# Patient Record
Sex: Female | Born: 1970 | Race: Black or African American | Hispanic: No | Marital: Single | Smoking: Current every day smoker
Health system: Southern US, Community
[De-identification: ages and names within clinical notes are randomized; demographics above are authoritative.]

## PROBLEM LIST (undated history)

## (undated) DIAGNOSIS — F419 Anxiety disorder, unspecified: Secondary | ICD-10-CM

## (undated) DIAGNOSIS — I214 Non-ST elevation (NSTEMI) myocardial infarction: Secondary | ICD-10-CM

## (undated) DIAGNOSIS — Z720 Tobacco use: Secondary | ICD-10-CM

## (undated) DIAGNOSIS — F32A Depression, unspecified: Secondary | ICD-10-CM

## (undated) DIAGNOSIS — I2510 Atherosclerotic heart disease of native coronary artery without angina pectoris: Secondary | ICD-10-CM

## (undated) DIAGNOSIS — E785 Hyperlipidemia, unspecified: Secondary | ICD-10-CM

## (undated) DIAGNOSIS — F329 Major depressive disorder, single episode, unspecified: Secondary | ICD-10-CM

## (undated) HISTORY — PX: TUBAL LIGATION: SHX77

## (undated) HISTORY — DX: Non-ST elevation (NSTEMI) myocardial infarction: I21.4

## (undated) HISTORY — PX: KNEE SURGERY: SHX244

## (undated) HISTORY — PX: TONSILLECTOMY: SUR1361

---

## 1999-04-06 ENCOUNTER — Other Ambulatory Visit: Admission: RE | Admit: 1999-04-06 | Discharge: 1999-04-06 | Payer: Self-pay | Admitting: Family Medicine

## 2000-04-18 ENCOUNTER — Other Ambulatory Visit: Admission: RE | Admit: 2000-04-18 | Discharge: 2000-04-18 | Payer: Self-pay | Admitting: *Deleted

## 2000-06-05 ENCOUNTER — Encounter: Payer: Self-pay | Admitting: Family Medicine

## 2000-06-05 ENCOUNTER — Ambulatory Visit (HOSPITAL_COMMUNITY): Admission: RE | Admit: 2000-06-05 | Discharge: 2000-06-05 | Payer: Self-pay | Admitting: Family Medicine

## 2000-06-26 ENCOUNTER — Ambulatory Visit (HOSPITAL_COMMUNITY): Admission: RE | Admit: 2000-06-26 | Discharge: 2000-06-26 | Payer: Self-pay | Admitting: Family Medicine

## 2000-06-26 ENCOUNTER — Encounter: Payer: Self-pay | Admitting: Family Medicine

## 2000-06-28 ENCOUNTER — Ambulatory Visit (HOSPITAL_COMMUNITY): Admission: RE | Admit: 2000-06-28 | Discharge: 2000-06-28 | Payer: Self-pay | Admitting: Obstetrics

## 2003-02-28 ENCOUNTER — Other Ambulatory Visit: Admission: RE | Admit: 2003-02-28 | Discharge: 2003-02-28 | Payer: Self-pay | Admitting: Family Medicine

## 2012-12-19 ENCOUNTER — Encounter (HOSPITAL_BASED_OUTPATIENT_CLINIC_OR_DEPARTMENT_OTHER): Payer: Self-pay

## 2012-12-19 ENCOUNTER — Emergency Department (HOSPITAL_BASED_OUTPATIENT_CLINIC_OR_DEPARTMENT_OTHER)
Admission: EM | Admit: 2012-12-19 | Discharge: 2012-12-19 | Disposition: A | Discharge status: Home / Self Care | Payer: Self-pay | Attending: Emergency Medicine | Admitting: Emergency Medicine

## 2012-12-19 DIAGNOSIS — Z3202 Encounter for pregnancy test, result negative: Secondary | ICD-10-CM | POA: Insufficient documentation

## 2012-12-19 DIAGNOSIS — R5381 Other malaise: Secondary | ICD-10-CM | POA: Insufficient documentation

## 2012-12-19 DIAGNOSIS — F17200 Nicotine dependence, unspecified, uncomplicated: Secondary | ICD-10-CM | POA: Insufficient documentation

## 2012-12-19 DIAGNOSIS — F1210 Cannabis abuse, uncomplicated: Secondary | ICD-10-CM | POA: Insufficient documentation

## 2012-12-19 DIAGNOSIS — R109 Unspecified abdominal pain: Secondary | ICD-10-CM | POA: Insufficient documentation

## 2012-12-19 DIAGNOSIS — N390 Urinary tract infection, site not specified: Secondary | ICD-10-CM | POA: Insufficient documentation

## 2012-12-19 LAB — URINALYSIS, ROUTINE W REFLEX MICROSCOPIC
Bilirubin Urine: NEGATIVE
Glucose, UA: NEGATIVE mg/dL
Ketones, ur: NEGATIVE mg/dL
Nitrite: NEGATIVE
Protein, ur: NEGATIVE mg/dL
Specific Gravity, Urine: 1.02 (ref 1.005–1.030)
Urobilinogen, UA: 0.2 mg/dL (ref 0.0–1.0)
pH: 5.5 (ref 5.0–8.0)

## 2012-12-19 LAB — URINE MICROSCOPIC-ADD ON

## 2012-12-19 LAB — PREGNANCY, URINE: Preg Test, Ur: NEGATIVE

## 2012-12-19 MED ORDER — PHENAZOPYRIDINE HCL 200 MG PO TABS: 200.0000 mg | ORAL_TABLET | Freq: Three times a day (TID) | ORAL | Status: DC | PRN

## 2012-12-19 MED ORDER — CIPROFLOXACIN HCL 500 MG PO TABS: 500.0000 mg | ORAL_TABLET | Freq: Two times a day (BID) | ORAL | Status: DC

## 2012-12-19 NOTE — ED Notes (Signed)
Pt reports low abdominal pain, urinary frequency and fatigue x 2 days.

## 2012-12-19 NOTE — ED Provider Notes (Signed)
History     CSN: [id number]  Arrival date & time 12/19/12  1026   First MD Initiated Contact with Patient 12/19/12 1053      Chief Complaint  Patient presents with  . Urinary Frequency  . Abdominal Pain  . Fatigue    (Consider location/radiation/quality/duration/timing/severity/associated sxs/prior treatment) HPI Comments: The patient presents with lower abdominal discomfort, urinary frequency for the past two days.  The last period was last week and normal.  No abnormal bleeding or discharge.  Sexually active with same partner for the past several years.  Patient is a 42 y.o. female presenting with frequency. The history is provided by the patient.  Urinary Frequency This is a new problem. The current episode started 2 days ago. The problem occurs constantly. The problem has been gradually worsening. Nothing aggravates the symptoms. Nothing relieves the symptoms. She has tried nothing for the symptoms.    History reviewed. No pertinent past medical history.  Past Surgical History  Procedure Laterality Date  . Knee surgery      No family history on file.  History  Substance Use Topics  . Smoking status: Current Every Day Smoker -- 0.50 packs/day    Types: Cigarettes  . Smokeless tobacco: Not on file  . Alcohol Use: Yes     Comment: occasional    OB History   Grav Para Term Preterm Abortions TAB SAB Ect Mult Living                  Review of Systems  Genitourinary: Positive for frequency.  All other systems reviewed and are negative.    Allergies  Review of patient's allergies indicates no known allergies.  Home Medications  No current outpatient prescriptions on file.  BP 154/74  Pulse 81  Temp(Src) 97.9 F (36.6 C) (Oral)  Resp 20  Ht 5\' 1"  (1.549 m)  Wt 195 lb (88.451 kg)  BMI 36.86 kg/m2  SpO2 100%  Physical Exam  Nursing note and vitals reviewed. Constitutional: She is oriented to person, place, and time. She appears well-developed and  well-nourished. No distress.  HENT:  Head: Normocephalic and atraumatic.  Neck: Normal range of motion. Neck supple.  Cardiovascular: Normal rate and regular rhythm.  Exam reveals no gallop and no friction rub.   No murmur heard. Pulmonary/Chest: Effort normal and breath sounds normal. No respiratory distress. She has no wheezes.  Abdominal: Soft. Bowel sounds are normal. She exhibits no distension. There is no tenderness.  Musculoskeletal: Normal range of motion.  Neurological: She is alert and oriented to person, place, and time.  Skin: Skin is warm and dry. She is not diaphoretic.    ED Course  Procedures (including critical care time)  Labs Reviewed  PREGNANCY, URINE  URINALYSIS, ROUTINE W REFLEX MICROSCOPIC   No results found.   No diagnosis found.    MDM  UA is suggestive of uti.  Will treat with cipro, pyridium, return prn if she worsens.          Geoffery Lyons, MD 12/19/12 1125

## 2012-12-21 LAB — URINE CULTURE: Colony Count: 3000

## 2013-06-11 ENCOUNTER — Emergency Department (HOSPITAL_BASED_OUTPATIENT_CLINIC_OR_DEPARTMENT_OTHER)
Admission: EM | Admit: 2013-06-11 | Discharge: 2013-06-11 | Disposition: A | Discharge status: Home / Self Care | Payer: Self-pay | Attending: Emergency Medicine | Admitting: Emergency Medicine

## 2013-06-11 ENCOUNTER — Encounter (HOSPITAL_BASED_OUTPATIENT_CLINIC_OR_DEPARTMENT_OTHER): Payer: Self-pay | Admitting: *Deleted

## 2013-06-11 ENCOUNTER — Emergency Department (HOSPITAL_BASED_OUTPATIENT_CLINIC_OR_DEPARTMENT_OTHER): Payer: Self-pay

## 2013-06-11 DIAGNOSIS — M25512 Pain in left shoulder: Secondary | ICD-10-CM

## 2013-06-11 DIAGNOSIS — R059 Cough, unspecified: Secondary | ICD-10-CM | POA: Insufficient documentation

## 2013-06-11 DIAGNOSIS — F17200 Nicotine dependence, unspecified, uncomplicated: Secondary | ICD-10-CM | POA: Insufficient documentation

## 2013-06-11 DIAGNOSIS — M549 Dorsalgia, unspecified: Secondary | ICD-10-CM | POA: Insufficient documentation

## 2013-06-11 DIAGNOSIS — R079 Chest pain, unspecified: Secondary | ICD-10-CM | POA: Insufficient documentation

## 2013-06-11 DIAGNOSIS — M25519 Pain in unspecified shoulder: Secondary | ICD-10-CM | POA: Insufficient documentation

## 2013-06-11 DIAGNOSIS — R05 Cough: Secondary | ICD-10-CM | POA: Insufficient documentation

## 2013-06-11 DIAGNOSIS — J3489 Other specified disorders of nose and nasal sinuses: Secondary | ICD-10-CM | POA: Insufficient documentation

## 2013-06-11 MED ORDER — ALBUTEROL SULFATE (5 MG/ML) 0.5% IN NEBU
5.0000 mg | INHALATION_SOLUTION | Freq: Once | RESPIRATORY_TRACT | Status: AC
  Administered 2013-06-11: 5 mg via RESPIRATORY_TRACT
  Filled 2013-06-11: qty 1

## 2013-06-11 MED ORDER — IBUPROFEN 400 MG PO TABS
600.0000 mg | ORAL_TABLET | Freq: Once | ORAL | Status: AC
  Administered 2013-06-11: 600 mg via ORAL
  Filled 2013-06-11: qty 1

## 2013-06-11 MED ORDER — IBUPROFEN 600 MG PO TABS: 600.0000 mg | ORAL_TABLET | Freq: Three times a day (TID) | ORAL | Status: DC | PRN

## 2013-06-11 MED ORDER — IPRATROPIUM BROMIDE 0.02 % IN SOLN
0.5000 mg | Freq: Once | RESPIRATORY_TRACT | Status: AC
  Administered 2013-06-11: 0.5 mg via RESPIRATORY_TRACT
  Filled 2013-06-11: qty 2.5

## 2013-06-11 MED ORDER — OXYCODONE-ACETAMINOPHEN 5-325 MG PO TABS
1.0000 | ORAL_TABLET | Freq: Once | ORAL | Status: AC
  Administered 2013-06-11: 1 via ORAL
  Filled 2013-06-11 (×2): qty 1

## 2013-06-11 MED ORDER — DIAZEPAM 5 MG PO TABS
5.0000 mg | ORAL_TABLET | Freq: Once | ORAL | Status: AC
  Administered 2013-06-11: 5 mg via ORAL
  Filled 2013-06-11: qty 1

## 2013-06-11 MED ORDER — DIAZEPAM 5 MG PO TABS: 5.0000 mg | ORAL_TABLET | Freq: Three times a day (TID) | ORAL | Status: DC | PRN

## 2013-06-11 MED ORDER — HYDROCODONE-ACETAMINOPHEN 5-325 MG PO TABS: 1.0000 | ORAL_TABLET | ORAL | Status: DC | PRN

## 2013-06-11 NOTE — ED Notes (Signed)
MD at bedside. 

## 2013-06-11 NOTE — ED Notes (Signed)
Chest pain x 2 weeks. Radiation down her left arm.

## 2013-06-11 NOTE — ED Provider Notes (Signed)
CSN: [id number]     Arrival date & time 06/11/13  1946 History     First MD Initiated Contact with Patient 06/11/13 2002     Chief Complaint  Patient presents with  . Chest Pain    HPI Patient reports worsening pain in her chest and left trapezius region with radiation down her left arm over the past 2-3 weeks.  She states her pain is been constant and present over the past 2 weeks.  She reports that her pain in her left back and shoulder and arm is worse when she uses her left upper extremity.  She denies shortness of breath.  She has a cough and congestion.  She reports her cough is worsened over the past several days that she's been decreasing her tobacco use.  No history of DVT or pulmonary embolism.  No unilateral leg swelling.  No history of coronary artery disease.  No history of heart failure.   History reviewed. No pertinent past medical history. Past Surgical History  Procedure Laterality Date  . Knee surgery     No family history on file. History  Substance Use Topics  . Smoking status: Current Every Day Smoker -- 0.50 packs/day    Types: Cigarettes  . Smokeless tobacco: Not on file  . Alcohol Use: Yes     Comment: occasional   OB History   Grav Para Term Preterm Abortions TAB SAB Ect Mult Living                 Review of Systems  All other systems reviewed and are negative.    Allergies  Review of patient's allergies indicates no known allergies.  Home Medications   Current Outpatient Rx  Name  Route  Sig  Dispense  Refill  . ciprofloxacin (CIPRO) 500 MG tablet   Oral   Take 1 tablet (500 mg total) by mouth 2 (two) times daily. One po bid x 7 days   10 tablet   0   . diazepam (VALIUM) 5 MG tablet   Oral   Take 1 tablet (5 mg total) by mouth every 8 (eight) hours as needed (spasm).   10 tablet   0   . HYDROcodone-acetaminophen (NORCO/VICODIN) 5-325 MG per tablet   Oral   Take 1 tablet by mouth every 4 (four) hours as needed for pain.   15 tablet   0   . ibuprofen (ADVIL,MOTRIN) 600 MG tablet   Oral   Take 1 tablet (600 mg total) by mouth every 8 (eight) hours as needed for pain.   15 tablet   0   . phenazopyridine (PYRIDIUM) 200 MG tablet   Oral   Take 1 tablet (200 mg total) by mouth 3 (three) times daily as needed for pain.   6 tablet   0    BP 153/85  Pulse 68  Temp(Src) 99.2 F (37.3 C) (Oral)  Resp 20  Ht 5\' 1"  (1.549 m)  Wt 195 lb (88.451 kg)  BMI 36.86 kg/m2  SpO2 100% Physical Exam  Nursing note and vitals reviewed. Constitutional: She is oriented to person, place, and time. She appears well-developed and well-nourished. No distress.  HENT:  Head: Normocephalic and atraumatic.  Eyes: EOM are normal.  Neck: Normal range of motion.  Cardiovascular: Normal rate, regular rhythm and normal heart sounds.   Pulmonary/Chest: Effort normal and breath sounds normal.  Abdominal: Soft. She exhibits no distension. There is no tenderness.  Musculoskeletal: Normal range of motion.  Tenderness at  with associated spasm of left trapezius region.  Normal left radial pulse.  Normal grip strength bilaterally.  Neurological: She is alert and oriented to person, place, and time.  Skin: Skin is warm and dry.  Psychiatric: She has a normal mood and affect. Judgment normal.    ED Course   Procedures (including critical care time)   Date: 06/11/2013  Rate: 68  Rhythm: normal sinus rhythm  QRS Axis: normal  Intervals: normal  ST/T Wave abnormalities: normal  Conduction Disutrbances: none  Narrative Interpretation:   Old EKG Reviewed: no prior ecg    Labs Reviewed - No data to display Dg Chest 2 View  06/11/2013   *RADIOLOGY REPORT*  Clinical Data: Chest pain  CHEST - 2 VIEW  Comparison:  None.  Views: PA and lateral views.  FINDINGS: There is no focal infiltrate, pulmonary edema, or pleural effusion. The mediastinal contour and cardiac silhouette are normal. The soft tissue and osseous structures are normal.  IMPRESSION:  No acute cardiopulmonary disease identified.   Original Report Authenticated By: Sherian Rein, M.D.   I personally reviewed the imaging tests through PACS system I reviewed available ER/hospitalization records through the EMR   1. Chest pain   2. Left shoulder pain     MDM  Pain in her chest and left trapezius region seems to be more musculoskeletal in nature.  She does feel slightly better after breathing treatment.  She would discharge home with albuterol inhaler.  Home with muscle relaxants and pain medicine.  She understands return to the ER for new or worsening symptoms.  Lyanne Co, MD 06/11/13 2212

## 2014-06-17 ENCOUNTER — Encounter (HOSPITAL_BASED_OUTPATIENT_CLINIC_OR_DEPARTMENT_OTHER): Payer: Self-pay | Admitting: Emergency Medicine

## 2014-06-17 ENCOUNTER — Emergency Department (HOSPITAL_BASED_OUTPATIENT_CLINIC_OR_DEPARTMENT_OTHER)
Admission: EM | Admit: 2014-06-17 | Discharge: 2014-06-18 | Disposition: A | Discharge status: Home / Self Care | Payer: 59 | Attending: Emergency Medicine | Admitting: Emergency Medicine

## 2014-06-17 DIAGNOSIS — R110 Nausea: Secondary | ICD-10-CM | POA: Insufficient documentation

## 2014-06-17 DIAGNOSIS — R1013 Epigastric pain: Secondary | ICD-10-CM | POA: Diagnosis not present

## 2014-06-17 DIAGNOSIS — Z3202 Encounter for pregnancy test, result negative: Secondary | ICD-10-CM | POA: Insufficient documentation

## 2014-06-17 DIAGNOSIS — F17200 Nicotine dependence, unspecified, uncomplicated: Secondary | ICD-10-CM | POA: Diagnosis not present

## 2014-06-17 DIAGNOSIS — R109 Unspecified abdominal pain: Secondary | ICD-10-CM | POA: Diagnosis present

## 2014-06-17 DIAGNOSIS — Z79899 Other long term (current) drug therapy: Secondary | ICD-10-CM | POA: Diagnosis not present

## 2014-06-17 LAB — COMPREHENSIVE METABOLIC PANEL
ALBUMIN: 3.4 g/dL — AB (ref 3.5–5.2)
ALT: 9 U/L (ref 0–35)
ANION GAP: 13 (ref 5–15)
AST: 17 U/L (ref 0–37)
Alkaline Phosphatase: 84 U/L (ref 39–117)
BUN: 7 mg/dL (ref 6–23)
CALCIUM: 9.4 mg/dL (ref 8.4–10.5)
CO2: 23 mEq/L (ref 19–32)
Chloride: 101 mEq/L (ref 96–112)
Creatinine, Ser: 0.9 mg/dL (ref 0.50–1.10)
GFR calc non Af Amer: 77 mL/min — ABNORMAL LOW (ref >90)
GFR, EST AFRICAN AMERICAN: 89 mL/min — AB (ref >90)
GLUCOSE: 77 mg/dL (ref 70–99)
Potassium: 4 mEq/L (ref 3.7–5.3)
Sodium: 137 mEq/L (ref 137–147)
TOTAL PROTEIN: 7.6 g/dL (ref 6.0–8.3)
Total Bilirubin: <0.2 mg/dL — ABNORMAL LOW (ref 0.3–1.2)

## 2014-06-17 LAB — CBC
HEMATOCRIT: 36.4 % (ref 36.0–46.0)
HEMOGLOBIN: 12.3 g/dL (ref 12.0–15.0)
MCH: 25.3 pg — AB (ref 26.0–34.0)
MCHC: 33.8 g/dL (ref 30.0–36.0)
MCV: 74.7 fL — ABNORMAL LOW (ref 78.0–100.0)
Platelets: 268 10*3/uL (ref 150–400)
RBC: 4.87 MIL/uL (ref 3.87–5.11)
RDW: 15.8 % — AB (ref 11.5–15.5)
WBC: 6.2 10*3/uL (ref 4.0–10.5)

## 2014-06-17 LAB — URINALYSIS, ROUTINE W REFLEX MICROSCOPIC
Bilirubin Urine: NEGATIVE
Glucose, UA: NEGATIVE mg/dL
HGB URINE DIPSTICK: NEGATIVE
Ketones, ur: NEGATIVE mg/dL
Nitrite: NEGATIVE
PH: 6 (ref 5.0–8.0)
Protein, ur: NEGATIVE mg/dL
SPECIFIC GRAVITY, URINE: 1.018 (ref 1.005–1.030)
UROBILINOGEN UA: 1 mg/dL (ref 0.0–1.0)

## 2014-06-17 LAB — URINE MICROSCOPIC-ADD ON

## 2014-06-17 LAB — PREGNANCY, URINE: PREG TEST UR: NEGATIVE

## 2014-06-17 LAB — LIPASE, BLOOD: LIPASE: 20 U/L (ref 11–59)

## 2014-06-17 MED ORDER — LANSOPRAZOLE 30 MG PO CPDR
30.0000 mg | DELAYED_RELEASE_CAPSULE | Freq: Every day | ORAL | Status: DC
Start: 2014-06-17 — End: 2016-07-08

## 2014-06-17 MED ORDER — ONDANSETRON HCL 4 MG/2ML IJ SOLN
4.0000 mg | Freq: Once | INTRAMUSCULAR | Status: AC
  Administered 2014-06-17: 4 mg via INTRAVENOUS
  Filled 2014-06-17: qty 2

## 2014-06-17 MED ORDER — MORPHINE SULFATE 4 MG/ML IJ SOLN
4.0000 mg | Freq: Once | INTRAMUSCULAR | Status: AC
  Administered 2014-06-17: 4 mg via INTRAVENOUS
  Filled 2014-06-17: qty 1

## 2014-06-17 MED ORDER — GI COCKTAIL ~~LOC~~
30.0000 mL | Freq: Once | ORAL | Status: AC
  Administered 2014-06-17: 30 mL via ORAL
  Filled 2014-06-17: qty 30

## 2014-06-17 MED ORDER — GI COCKTAIL ~~LOC~~: 30.0000 mL | Freq: Once | ORAL | Status: DC

## 2014-06-17 NOTE — ED Notes (Signed)
Ginger ale given to patient. 

## 2014-06-17 NOTE — ED Notes (Signed)
Epigastric pain x5 weeks. Occasional vomiting but not today. Denies diarrhea. For a few days both breasts have been tender.

## 2014-06-17 NOTE — Discharge Instructions (Signed)
Return to the ED with any concerns including vomiting and not able to keep down liquids, fever/chills, worsening pain, decreased level of alertness/lethargy  If your pain continues you should arrange for a followup appointment with your primary care doctor and may need and abdominal ultrasound

## 2014-06-17 NOTE — ED Provider Notes (Signed)
CSN: [id number]     Arrival date & time 06/17/14  1844 History   First MD Initiated Contact with Patient 06/17/14 2057     This chart was scribed for Ethelda ChickMartha K Linker, MD by Arlan OrganAshley Leger, ED Scribe. This patient was seen in room MH04/MH04 and the patient's care was started 9:33 PM.   Chief Complaint  Patient presents with  . Abdominal Pain   The history is provided by the patient. No language interpreter was used.    HPI Comments: Nancy Ayers is a 43 y.o. female who presents to the Emergency Department complaining of waxing and waning, moderate epigastric abdominal pain x 6 months that has progressively worsened in the last 4 weeks. Pt admits to some nausea associated with abdominal pain. She has tried OTC Asprin, Ibuprofen, and Tylenol without any improvement for symptoms. She has also tried Prilosec and Zantac with no relief for pain. At this time she denies any vomiting, fever, dysuria or chills. No known allergies to medications. No other concerns this visit.  History reviewed. No pertinent past medical history. Past Surgical History  Procedure Laterality Date  . Knee surgery    . Knee surgery    . Tubal ligation     No family history on file. History  Substance Use Topics  . Smoking status: Current Every Day Smoker -- 0.50 packs/day    Types: Cigarettes  . Smokeless tobacco: Not on file  . Alcohol Use: Yes     Comment: occasional   OB History   Grav Para Term Preterm Abortions TAB SAB Ect Mult Living                 Review of Systems  Constitutional: Negative for fever and chills.  Gastrointestinal: Positive for nausea and abdominal pain. Negative for vomiting.  Genitourinary: Negative for dysuria.  Psychiatric/Behavioral: Negative for confusion.      Allergies  Review of patient's allergies indicates no known allergies.  Home Medications   Prior to Admission medications   Medication Sig Start Date End Date Taking? Authorizing Provider  ciprofloxacin (CIPRO)  500 MG tablet Take 1 tablet (500 mg total) by mouth 2 (two) times daily. One po bid x 7 days 12/19/12   Geoffery Lyonsouglas Delo, MD  diazepam (VALIUM) 5 MG tablet Take 1 tablet (5 mg total) by mouth every 8 (eight) hours as needed (spasm). 06/11/13   Lyanne CoKevin M Campos, MD  HYDROcodone-acetaminophen (NORCO/VICODIN) 5-325 MG per tablet Take 1 tablet by mouth every 4 (four) hours as needed for pain. 06/11/13   Lyanne CoKevin M Campos, MD  ibuprofen (ADVIL,MOTRIN) 600 MG tablet Take 1 tablet (600 mg total) by mouth every 8 (eight) hours as needed for pain. 06/11/13   Lyanne CoKevin M Campos, MD  lansoprazole (PREVACID) 30 MG capsule Take 1 capsule (30 mg total) by mouth daily at 12 noon. 06/17/14   Ethelda ChickMartha K Linker, MD  phenazopyridine (PYRIDIUM) 200 MG tablet Take 1 tablet (200 mg total) by mouth 3 (three) times daily as needed for pain. 12/19/12   Geoffery Lyonsouglas Delo, MD   Triage Vitals: BP 158/101  Pulse 68  Temp(Src) 98.1 F (36.7 C) (Oral)  Resp 16  Ht 5\' 2"  (1.575 m)  Wt 195 lb (88.451 kg)  BMI 35.66 kg/m2  SpO2 100%  LMP 05/29/2014    Physical Exam  Nursing note and vitals reviewed. Constitutional: She is oriented to person, place, and time. She appears well-developed and well-nourished.  HENT:  Head: Normocephalic.  Eyes: EOM are normal.  Neck:  Normal range of motion.  Pulmonary/Chest: Effort normal.  Abdominal: She exhibits no distension.  Musculoskeletal: Normal range of motion.  Neurological: She is alert and oriented to person, place, and time.  Psychiatric: She has a normal mood and affect.  Note- mild epigastric tenderness to palpation, no gaurding or rebound tenderness, NABS, no conjunctival injection, no scleral icterus, Skin- no rash  ED Course  Procedures (including critical care time)  DIAGNOSTIC STUDIES: Oxygen Saturation is 100% on RA, Normal by my interpretation.    COORDINATION OF CARE: 9:33 PM- Will order CBC, CMP, lipase, pregnancy urine, and urinalysis. Discussed treatment plan with pt at bedside and pt  agreed to plan.    .   Labs Review Labs Reviewed  URINALYSIS, ROUTINE W REFLEX MICROSCOPIC - Abnormal; Notable for the following:    APPearance CLOUDY (*)    Leukocytes, UA SMALL (*)    All other components within normal limits  URINE MICROSCOPIC-ADD ON - Abnormal; Notable for the following:    Squamous Epithelial / LPF MANY (*)    Bacteria, UA FEW (*)    All other components within normal limits  CBC - Abnormal; Notable for the following:    MCV 74.7 (*)    MCH 25.3 (*)    RDW 15.8 (*)    All other components within normal limits  COMPREHENSIVE METABOLIC PANEL - Abnormal; Notable for the following:    Albumin 3.4 (*)    Total Bilirubin <0.2 (*)    GFR calc non Af Amer 77 (*)    GFR calc Af Amer 89 (*)    All other components within normal limits  PREGNANCY, URINE  LIPASE, BLOOD    Imaging Review No results found.   EKG Interpretation None      MDM   Final diagnoses:  Epigastric pain   Pt presenting with c/o epigastric pain.  Labs reassuring, IV fluids and pain and nausea meds given along with GI cocktail.  Pt advised that if symptoms continue she may need abdominal ultrasound as an outpatient.  Discharged with strict return precautions.  Pt agreeable with plan.  I personally performed the services described in this documentation, which was scribed in my presence. The recorded information has been reviewed and is accurate.    Ethelda Chick, MD 06/21/14 [phone]

## 2016-07-08 ENCOUNTER — Encounter (HOSPITAL_BASED_OUTPATIENT_CLINIC_OR_DEPARTMENT_OTHER): Payer: Self-pay

## 2016-07-08 ENCOUNTER — Emergency Department (HOSPITAL_BASED_OUTPATIENT_CLINIC_OR_DEPARTMENT_OTHER)
Admission: EM | Admit: 2016-07-08 | Discharge: 2016-07-08 | Disposition: A | Discharge status: Home / Self Care | Payer: 59 | Attending: Emergency Medicine | Admitting: Emergency Medicine

## 2016-07-08 DIAGNOSIS — F17210 Nicotine dependence, cigarettes, uncomplicated: Secondary | ICD-10-CM | POA: Insufficient documentation

## 2016-07-08 DIAGNOSIS — R319 Hematuria, unspecified: Secondary | ICD-10-CM

## 2016-07-08 DIAGNOSIS — R0602 Shortness of breath: Secondary | ICD-10-CM | POA: Diagnosis not present

## 2016-07-08 DIAGNOSIS — N390 Urinary tract infection, site not specified: Secondary | ICD-10-CM | POA: Insufficient documentation

## 2016-07-08 DIAGNOSIS — R1032 Left lower quadrant pain: Secondary | ICD-10-CM | POA: Diagnosis present

## 2016-07-08 DIAGNOSIS — R1030 Lower abdominal pain, unspecified: Secondary | ICD-10-CM

## 2016-07-08 DIAGNOSIS — R51 Headache: Secondary | ICD-10-CM | POA: Insufficient documentation

## 2016-07-08 LAB — COMPREHENSIVE METABOLIC PANEL
ALBUMIN: 3.6 g/dL (ref 3.5–5.0)
ALK PHOS: 82 U/L (ref 38–126)
ALT: 13 U/L — ABNORMAL LOW (ref 14–54)
ANION GAP: 6 (ref 5–15)
AST: 20 U/L (ref 15–41)
BILIRUBIN TOTAL: 0.4 mg/dL (ref 0.3–1.2)
BUN: 7 mg/dL (ref 6–20)
CALCIUM: 8.6 mg/dL — AB (ref 8.9–10.3)
CO2: 26 mmol/L (ref 22–32)
Chloride: 104 mmol/L (ref 101–111)
Creatinine, Ser: 0.84 mg/dL (ref 0.44–1.00)
GFR calc Af Amer: >60 mL/min (ref >60)
GFR calc non Af Amer: >60 mL/min (ref >60)
GLUCOSE: 74 mg/dL (ref 65–99)
Potassium: 3.7 mmol/L (ref 3.5–5.1)
Sodium: 136 mmol/L (ref 135–145)
TOTAL PROTEIN: 7.4 g/dL (ref 6.5–8.1)

## 2016-07-08 LAB — CBC WITH DIFFERENTIAL/PLATELET
BASOS PCT: 1 %
Basophils Absolute: 0 10*3/uL (ref 0.0–0.1)
EOS ABS: 0.1 10*3/uL (ref 0.0–0.7)
Eosinophils Relative: 3 %
HCT: 35.9 % — ABNORMAL LOW (ref 36.0–46.0)
Hemoglobin: 11.9 g/dL — ABNORMAL LOW (ref 12.0–15.0)
Lymphocytes Relative: 50 %
Lymphs Abs: 2.7 10*3/uL (ref 0.7–4.0)
MCH: 24 pg — ABNORMAL LOW (ref 26.0–34.0)
MCHC: 33.1 g/dL (ref 30.0–36.0)
MCV: 72.5 fL — ABNORMAL LOW (ref 78.0–100.0)
MONO ABS: 0.5 10*3/uL (ref 0.1–1.0)
MONOS PCT: 10 %
Neutro Abs: 1.9 10*3/uL (ref 1.7–7.7)
Neutrophils Relative %: 36 %
PLATELETS: 261 10*3/uL (ref 150–400)
RBC: 4.95 MIL/uL (ref 3.87–5.11)
RDW: 16.7 % — AB (ref 11.5–15.5)
WBC: 5.3 10*3/uL (ref 4.0–10.5)

## 2016-07-08 LAB — URINALYSIS, ROUTINE W REFLEX MICROSCOPIC
BILIRUBIN URINE: NEGATIVE
GLUCOSE, UA: NEGATIVE mg/dL
Ketones, ur: NEGATIVE mg/dL
NITRITE: NEGATIVE
PH: 6 (ref 5.0–8.0)
Protein, ur: 30 mg/dL — AB
SPECIFIC GRAVITY, URINE: 1.019 (ref 1.005–1.030)

## 2016-07-08 LAB — URINE MICROSCOPIC-ADD ON

## 2016-07-08 LAB — LIPASE, BLOOD: Lipase: 19 U/L (ref 11–51)

## 2016-07-08 LAB — PREGNANCY, URINE: Preg Test, Ur: NEGATIVE

## 2016-07-08 MED ORDER — ONDANSETRON HCL 4 MG PO TABS: 4.0000 mg | ORAL_TABLET | Freq: Four times a day (QID) | ORAL | 0 refills | Status: DC

## 2016-07-08 MED ORDER — NITROFURANTOIN MONOHYD MACRO 100 MG PO CAPS: 100.0000 mg | ORAL_CAPSULE | Freq: Two times a day (BID) | ORAL | 0 refills | Status: DC

## 2016-07-08 MED ORDER — NITROFURANTOIN MONOHYD MACRO 100 MG PO CAPS
100.0000 mg | ORAL_CAPSULE | Freq: Once | ORAL | Status: AC
  Administered 2016-07-08: 100 mg via ORAL
  Filled 2016-07-08: qty 1

## 2016-07-08 MED ORDER — DICYCLOMINE HCL 10 MG PO CAPS
10.0000 mg | ORAL_CAPSULE | Freq: Once | ORAL | Status: AC
  Administered 2016-07-08: 10 mg via ORAL
  Filled 2016-07-08: qty 1

## 2016-07-08 NOTE — ED Provider Notes (Signed)
Emergency Department Provider Note By signing my name below, I, Emmanuella Mensah, attest that this documentation has been prepared under the direction and in the presence of Maia PlanJoshua G Millena Callins, MD. Electronically Signed: Angelene GiovanniEmmanuella Mensah, ED Scribe. 07/08/16. 8:36 PM.   Maia PlanJoshua G Ilena Dieckman, MD has reviewed the triage vital signs and the nursing notes.   HISTORY  Chief Complaint Abdominal Pain   HPI Comments: Nancy PhillipsWillette Ayers is a 45 y.o. female who presents to the Emergency Department complaining of ongoing gradually worsening moderate left lower abdominal pain onset 2 weeks ago. She notes that her pain radiates upward in a "C" shape. She reports associated increasing SOB, ongoing 7/10 constant headache and hematuria with multiple episodes of bright red blood as she wipes onset one week ago. She adds that she has tried to wear a pad but she only sees blood when she urinates and when she wipes. No alleviating factors noted. She states that she has tried Aspirin and Ibuprofen with no relief. She denies a hx of abdominal surgeries. She also denies any fever, chills, back pain, neck pain, or any n/v/d.    History reviewed. No pertinent past medical history.  There are no active problems to display for this patient.   Past Surgical History:  Procedure Laterality Date  . KNEE SURGERY    . KNEE SURGERY    . TUBAL LIGATION        Allergies Review of patient's allergies indicates no known allergies.  No family history on file.  Social History Social History  Substance Use Topics  . Smoking status: Current Every Day Smoker    Packs/day: 0.50    Types: Cigarettes  . Smokeless tobacco: Never Used  . Alcohol use Yes     Comment: occasional    Review of Systems Constitutional: No fever/chills Eyes: No visual changes. ENT: No sore throat. Cardiovascular: Denies chest pain. Respiratory: Shortness of breath. Gastrointestinal: Abdominal pain.  No nausea, no vomiting.  No diarrhea.  No  constipation. Genitourinary: Negative for dysuria. Musculoskeletal: Negative for back pain. Skin: Negative for rash. Neurological: Headache. Negative for focal weakness or numbness.  10-point ROS otherwise negative.  ____________________________________________   PHYSICAL EXAM:  VITAL SIGNS: ED Triage Vitals  Enc Vitals Group     BP 07/08/16 1925 156/92     Pulse Rate 07/08/16 1925 72     Resp 07/08/16 1925 18     Temp 07/08/16 1925 98.1 F (36.7 C)     Temp Source 07/08/16 1925 Oral     SpO2 07/08/16 1925 98 %     Weight 07/08/16 1926 234 lb (106.1 kg)     Height 07/08/16 1926 5\' 1"  (1.549 m)     Pain Score 07/08/16 1924 8    Constitutional: Alert and oriented. Well appearing and in no acute distress. Eyes: Conjunctivae are normal.  Head: Atraumatic. Nose: No congestion/rhinnorhea. Mouth/Throat: Mucous membranes are moist.  Oropharynx non-erythematous. Neck: No stridor.  Cardiovascular: Normal rate, regular rhythm. Good peripheral circulation. Grossly normal heart sounds.   Respiratory: Normal respiratory effort.  No retractions. Lungs CTAB. Gastrointestinal: Soft and nontender. No distention.  Musculoskeletal: No lower extremity tenderness nor edema. No gross deformities of extremities. Neurologic:  Normal speech and language. No gross focal neurologic deficits are appreciated.  Skin:  Skin is warm, dry and intact. No rash noted. Psychiatric: Mood and affect are normal. Speech and behavior are normal.  ____________________________________________ DIAGNOSTIC STUDIES: Oxygen Saturation is 98% on RA, normal by my interpretation.  COORDINATION OF CARE: 8:15 PM- Pt advised of plan for treatment and pt agrees. She will receive lab work for further evaluation. Pt will also receive Bentyl.    LABS (all labs ordered are listed, but only abnormal results are displayed)  Labs Reviewed  URINALYSIS, ROUTINE W REFLEX MICROSCOPIC (NOT AT [facility]) - Abnormal; Notable for the  following:       Result Value   APPearance CLOUDY (*)    Hgb urine dipstick LARGE (*)    Protein, ur 30 (*)    Leukocytes, UA LARGE (*)    All other components within normal limits  COMPREHENSIVE METABOLIC PANEL - Abnormal; Notable for the following:    Calcium 8.6 (*)    ALT 13 (*)    All other components within normal limits  CBC WITH DIFFERENTIAL/PLATELET - Abnormal; Notable for the following:    Hemoglobin 11.9 (*)    HCT 35.9 (*)    MCV 72.5 (*)    MCH 24.0 (*)    RDW 16.7 (*)    All other components within normal limits  URINE MICROSCOPIC-ADD ON - Abnormal; Notable for the following:    Squamous Epithelial / LPF 6-30 (*)    Bacteria, UA FEW (*)    All other components within normal limits  PREGNANCY, URINE  LIPASE, BLOOD    ____________________________________________  RADIOLOGY  None ____________________________________________   PROCEDURES  Procedure(s) performed:   Procedures  None ____________________________________________   INITIAL IMPRESSION / ASSESSMENT AND PLAN / ED COURSE  Pertinent labs & imaging results that were available during my care of the patient were reviewed by me and considered in my medical decision making (see chart for details).  Patient with 2 weeks of lower abdominal discomfort, HA, and hematuria. Abdomen is soft and non-tender to palpation.   Differential diagnosis includes but is not exclusive to ectopic pregnancy, ovarian cyst, ovarian torsion, acute appendicitis, urinary tract infection, endometriosis, bowel obstruction, hernia, colitis, renal colic, gastroenteritis, volvulus etc.  Plan for labs and symptomatic management.   09:50 PM Patient with some evidence of urinary tract infection. She has no fever, CVA tenderness, or other symptoms to suggest pyelonephritis. We'll treat with Macrobid and discharge home with primary care physician follow-up. We'll also provide the contact information for OB/GYN should her symptoms not  resolve with UTI treatment. Discussed this plan in detail with the patient who verbalizes understanding and is in agreement. She is pleased at discharge.   At this time, I do not feel there is any life-threatening condition present. I have reviewed and discussed all results (EKG, imaging, lab, urine as appropriate), exam findings with patient. I have reviewed nursing notes and appropriate previous records.  I feel the patient is safe to be discharged home without further emergent workup. Discussed usual and customary return precautions. Patient and family (if present) verbalize understanding and are comfortable with this plan.  Patient will follow-up with their primary care provider. If they do not have a primary care provider, information for follow-up has been provided to them. All questions have been answered.  ____________________________________________  FINAL CLINICAL IMPRESSION(S) / ED DIAGNOSES  Final diagnoses:  Lower abdominal pain  Hematuria  UTI (lower urinary tract infection)     MEDICATIONS GIVEN DURING THIS VISIT:  Medications  dicyclomine (BENTYL) capsule 10 mg (10 mg Oral Given 07/08/16 2032)  nitrofurantoin (macrocrystal-monohydrate) (MACROBID) capsule 100 mg (100 mg Oral Given 07/08/16 2158)     NEW OUTPATIENT MEDICATIONS STARTED DURING THIS VISIT:  Discharge Medication List as  of 07/08/2016  9:53 PM    START taking these medications   Details  nitrofurantoin, macrocrystal-monohydrate, (MACROBID) 100 MG capsule Take 1 capsule (100 mg total) by mouth 2 (two) times daily., Starting Fri 07/08/2016, Print    ondansetron (ZOFRAN) 4 MG tablet Take 1 tablet (4 mg total) by mouth every 6 (six) hours., Starting Fri 07/08/2016, Print        Documentation performed with the assistance of the scribe. I have reviewed documentation and made changes as necessary.   Note:  This document was prepared using Dragon voice recognition software and may include unintentional dictation  errors.  Alona Bene, MD Emergency Medicine    Maia Plan, MD 07/09/16 [phone]

## 2016-07-08 NOTE — ED Triage Notes (Signed)
C/o abd pain, HA x 1 week-also c/o SOB-NAD-steady gait

## 2016-07-08 NOTE — Discharge Instructions (Signed)

## 2016-08-25 DIAGNOSIS — F1290 Cannabis use, unspecified, uncomplicated: Secondary | ICD-10-CM | POA: Insufficient documentation

## 2016-10-07 HISTORY — PX: COMBINED HYSTEROSCOPY DIAGNOSTIC / D&C: SUR297

## 2016-11-28 DIAGNOSIS — F321 Major depressive disorder, single episode, moderate: Secondary | ICD-10-CM | POA: Insufficient documentation

## 2017-06-15 ENCOUNTER — Emergency Department (HOSPITAL_BASED_OUTPATIENT_CLINIC_OR_DEPARTMENT_OTHER): Payer: Self-pay

## 2017-06-15 ENCOUNTER — Ambulatory Visit (HOSPITAL_COMMUNITY): Admit: 2017-06-15 | Payer: 59 | Admitting: Cardiology

## 2017-06-15 ENCOUNTER — Encounter (HOSPITAL_BASED_OUTPATIENT_CLINIC_OR_DEPARTMENT_OTHER): Payer: Self-pay | Admitting: *Deleted

## 2017-06-15 ENCOUNTER — Encounter (HOSPITAL_COMMUNITY)
Admission: EM | Disposition: A | Discharge status: Home / Self Care | Payer: Self-pay | Source: Home / Self Care | Attending: Cardiology

## 2017-06-15 ENCOUNTER — Inpatient Hospital Stay (HOSPITAL_BASED_OUTPATIENT_CLINIC_OR_DEPARTMENT_OTHER)
Admission: EM | Admit: 2017-06-15 | Discharge: 2017-06-19 | DRG: 281 | Disposition: A | Discharge status: Home / Self Care | Payer: 59 | Attending: Cardiology | Admitting: Cardiology

## 2017-06-15 DIAGNOSIS — F17210 Nicotine dependence, cigarettes, uncomplicated: Secondary | ICD-10-CM | POA: Diagnosis present

## 2017-06-15 DIAGNOSIS — E669 Obesity, unspecified: Secondary | ICD-10-CM | POA: Diagnosis present

## 2017-06-15 DIAGNOSIS — E785 Hyperlipidemia, unspecified: Secondary | ICD-10-CM

## 2017-06-15 DIAGNOSIS — I10 Essential (primary) hypertension: Secondary | ICD-10-CM | POA: Diagnosis present

## 2017-06-15 DIAGNOSIS — I2510 Atherosclerotic heart disease of native coronary artery without angina pectoris: Secondary | ICD-10-CM | POA: Diagnosis not present

## 2017-06-15 DIAGNOSIS — F419 Anxiety disorder, unspecified: Secondary | ICD-10-CM | POA: Diagnosis present

## 2017-06-15 DIAGNOSIS — Z6841 Body Mass Index (BMI) 40.0 and over, adult: Secondary | ICD-10-CM

## 2017-06-15 DIAGNOSIS — Z801 Family history of malignant neoplasm of trachea, bronchus and lung: Secondary | ICD-10-CM

## 2017-06-15 DIAGNOSIS — K760 Fatty (change of) liver, not elsewhere classified: Secondary | ICD-10-CM | POA: Diagnosis present

## 2017-06-15 DIAGNOSIS — I249 Acute ischemic heart disease, unspecified: Secondary | ICD-10-CM

## 2017-06-15 DIAGNOSIS — Z833 Family history of diabetes mellitus: Secondary | ICD-10-CM

## 2017-06-15 DIAGNOSIS — I25110 Atherosclerotic heart disease of native coronary artery with unstable angina pectoris: Secondary | ICD-10-CM | POA: Diagnosis present

## 2017-06-15 DIAGNOSIS — Z8249 Family history of ischemic heart disease and other diseases of the circulatory system: Secondary | ICD-10-CM

## 2017-06-15 DIAGNOSIS — F17200 Nicotine dependence, unspecified, uncomplicated: Secondary | ICD-10-CM

## 2017-06-15 DIAGNOSIS — Z832 Family history of diseases of the blood and blood-forming organs and certain disorders involving the immune mechanism: Secondary | ICD-10-CM

## 2017-06-15 DIAGNOSIS — I214 Non-ST elevation (NSTEMI) myocardial infarction: Principal | ICD-10-CM | POA: Diagnosis present

## 2017-06-15 DIAGNOSIS — F329 Major depressive disorder, single episode, unspecified: Secondary | ICD-10-CM | POA: Diagnosis present

## 2017-06-15 HISTORY — PX: INTRAVASCULAR PRESSURE WIRE/FFR STUDY: CATH118243

## 2017-06-15 HISTORY — DX: Tobacco use: Z72.0

## 2017-06-15 HISTORY — DX: Hyperlipidemia, unspecified: E78.5

## 2017-06-15 HISTORY — DX: Atherosclerotic heart disease of native coronary artery without angina pectoris: I25.10

## 2017-06-15 HISTORY — DX: Major depressive disorder, single episode, unspecified: F32.9

## 2017-06-15 HISTORY — DX: Depression, unspecified: F32.A

## 2017-06-15 HISTORY — DX: Anxiety disorder, unspecified: F41.9

## 2017-06-15 HISTORY — PX: LEFT HEART CATH AND CORONARY ANGIOGRAPHY: CATH118249

## 2017-06-15 LAB — TSH: TSH: 1.272 u[IU]/mL (ref 0.350–4.500)

## 2017-06-15 LAB — COMPREHENSIVE METABOLIC PANEL
ALBUMIN: 3.7 g/dL (ref 3.5–5.0)
ALK PHOS: 84 U/L (ref 38–126)
ALT: 15 U/L (ref 14–54)
AST: 33 U/L (ref 15–41)
Anion gap: 8 (ref 5–15)
BILIRUBIN TOTAL: 0.9 mg/dL (ref 0.3–1.2)
BUN: 8 mg/dL (ref 6–20)
CO2: 26 mmol/L (ref 22–32)
CREATININE: 0.93 mg/dL (ref 0.44–1.00)
Calcium: 8.6 mg/dL — ABNORMAL LOW (ref 8.9–10.3)
Chloride: 102 mmol/L (ref 101–111)
GFR calc Af Amer: >60 mL/min (ref >60)
GLUCOSE: 77 mg/dL (ref 65–99)
POTASSIUM: 4 mmol/L (ref 3.5–5.1)
Sodium: 136 mmol/L (ref 135–145)
TOTAL PROTEIN: 7.6 g/dL (ref 6.5–8.1)

## 2017-06-15 LAB — TROPONIN I
TROPONIN I: 1.03 ng/mL — AB (ref <0.03)
TROPONIN I: 1.09 ng/mL — AB (ref <0.03)

## 2017-06-15 LAB — LIPASE, BLOOD
LIPASE: 24 U/L (ref 11–51)
LIPASE: 23 U/L (ref 11–51)

## 2017-06-15 LAB — CBC
HCT: 40.2 % (ref 36.0–46.0)
HEMOGLOBIN: 14.1 g/dL (ref 12.0–15.0)
MCH: 25.5 pg — ABNORMAL LOW (ref 26.0–34.0)
MCHC: 35.1 g/dL (ref 30.0–36.0)
MCV: 72.6 fL — ABNORMAL LOW (ref 78.0–100.0)
PLATELETS: 233 10*3/uL (ref 150–400)
RBC: 5.54 MIL/uL — ABNORMAL HIGH (ref 3.87–5.11)
RDW: 18.4 % — AB (ref 11.5–15.5)
WBC: 5.9 10*3/uL (ref 4.0–10.5)

## 2017-06-15 LAB — HEMOGLOBIN A1C
Hgb A1c MFr Bld: 5.4 % (ref 4.8–5.6)
MEAN PLASMA GLUCOSE: 108.28 mg/dL

## 2017-06-15 SURGERY — LEFT HEART CATH AND CORONARY ANGIOGRAPHY
Anesthesia: LOCAL

## 2017-06-15 MED ORDER — KETOROLAC TROMETHAMINE 30 MG/ML IJ SOLN
30.0000 mg | Freq: Once | INTRAMUSCULAR | Status: AC
  Administered 2017-06-15: 30 mg via INTRAVENOUS
  Filled 2017-06-15: qty 1

## 2017-06-15 MED ORDER — ADENOSINE 12 MG/4ML IV SOLN
INTRAVENOUS | Status: AC
  Filled 2017-06-15: qty 16

## 2017-06-15 MED ORDER — SODIUM CHLORIDE 0.9 % IV BOLUS (SEPSIS)
1000.0000 mL | Freq: Once | INTRAVENOUS | Status: AC
  Administered 2017-06-15: 1000 mL via INTRAVENOUS

## 2017-06-15 MED ORDER — IOPAMIDOL (ISOVUE-370) INJECTION 76%
INTRAVENOUS | Status: AC
  Filled 2017-06-15: qty 100

## 2017-06-15 MED ORDER — ONDANSETRON HCL 4 MG/2ML IJ SOLN
4.0000 mg | Freq: Four times a day (QID) | INTRAMUSCULAR | Status: DC | PRN
  Administered 2017-06-17: 4 mg via INTRAVENOUS
  Filled 2017-06-15: qty 2

## 2017-06-15 MED ORDER — SODIUM CHLORIDE 0.9% FLUSH
3.0000 mL | Freq: Two times a day (BID) | INTRAVENOUS | Status: DC
  Administered 2017-06-16 – 2017-06-18 (×6): 3 mL via INTRAVENOUS

## 2017-06-15 MED ORDER — FENTANYL CITRATE (PF) 100 MCG/2ML IJ SOLN
INTRAMUSCULAR | Status: AC
  Filled 2017-06-15: qty 2

## 2017-06-15 MED ORDER — ADENOSINE (DIAGNOSTIC) 140MCG/KG/MIN
INTRAVENOUS | Status: DC | PRN
  Administered 2017-06-15: 140 ug/kg/min via INTRAVENOUS

## 2017-06-15 MED ORDER — SODIUM CHLORIDE 0.9 % IV SOLN
INTRAVENOUS | Status: DC | PRN
  Administered 2017-06-15: 10 mL/h via INTRAVENOUS

## 2017-06-15 MED ORDER — SODIUM CHLORIDE 0.9 % IV SOLN
INTRAVENOUS | Status: AC | PRN
  Administered 2017-06-15: 100 mL/h via INTRAVENOUS

## 2017-06-15 MED ORDER — HEPARIN BOLUS VIA INFUSION
4000.0000 [IU] | Freq: Once | INTRAVENOUS | Status: AC
  Administered 2017-06-15: 4000 [IU] via INTRAVENOUS

## 2017-06-15 MED ORDER — LIDOCAINE HCL (PF) 1 % IJ SOLN
INTRAMUSCULAR | Status: AC
  Filled 2017-06-15: qty 30

## 2017-06-15 MED ORDER — NITROGLYCERIN 0.4 MG SL SUBL
SUBLINGUAL_TABLET | SUBLINGUAL | Status: AC
  Filled 2017-06-15: qty 3

## 2017-06-15 MED ORDER — HEPARIN (PORCINE) IN NACL 2-0.9 UNIT/ML-% IJ SOLN
INTRAMUSCULAR | Status: AC
  Filled 2017-06-15: qty 500

## 2017-06-15 MED ORDER — ACETAMINOPHEN 325 MG PO TABS: 650.0000 mg | ORAL_TABLET | ORAL | Status: DC | PRN

## 2017-06-15 MED ORDER — NITROGLYCERIN IN D5W 200-5 MCG/ML-% IV SOLN
0.0000 ug/min | INTRAVENOUS | Status: DC
  Administered 2017-06-15: 5 ug/min via INTRAVENOUS

## 2017-06-15 MED ORDER — HEPARIN (PORCINE) IN NACL 100-0.45 UNIT/ML-% IJ SOLN
INTRAMUSCULAR | Status: AC
  Filled 2017-06-15: qty 250

## 2017-06-15 MED ORDER — SODIUM CHLORIDE 0.9 % IV SOLN: 1000.0000 mL | INTRAVENOUS | Status: DC

## 2017-06-15 MED ORDER — HEPARIN (PORCINE) IN NACL 2-0.9 UNIT/ML-% IJ SOLN
INTRAMUSCULAR | Status: DC | PRN
  Administered 2017-06-15: 20:00:00

## 2017-06-15 MED ORDER — MIDAZOLAM HCL 2 MG/2ML IJ SOLN
INTRAMUSCULAR | Status: DC | PRN
  Administered 2017-06-15: 2 mg via INTRAVENOUS

## 2017-06-15 MED ORDER — HEPARIN (PORCINE) IN NACL 100-0.45 UNIT/ML-% IJ SOLN
1150.0000 [IU]/h | INTRAMUSCULAR | Status: DC
  Administered 2017-06-16: 900 [IU]/h via INTRAVENOUS
  Administered 2017-06-17 – 2017-06-18 (×2): 1150 [IU]/h via INTRAVENOUS
  Filled 2017-06-15 (×3): qty 250

## 2017-06-15 MED ORDER — HEPARIN SODIUM (PORCINE) 1000 UNIT/ML IJ SOLN
INTRAMUSCULAR | Status: AC
  Filled 2017-06-15: qty 1

## 2017-06-15 MED ORDER — MORPHINE SULFATE (PF) 4 MG/ML IV SOLN
2.0000 mg | INTRAVENOUS | Status: DC | PRN
  Administered 2017-06-17 (×3): 2 mg via INTRAVENOUS
  Filled 2017-06-15 (×3): qty 1

## 2017-06-15 MED ORDER — CLOPIDOGREL BISULFATE 75 MG PO TABS
75.0000 mg | ORAL_TABLET | Freq: Every day | ORAL | Status: DC
  Administered 2017-06-16 – 2017-06-19 (×4): 75 mg via ORAL
  Filled 2017-06-15 (×4): qty 1

## 2017-06-15 MED ORDER — HEPARIN SODIUM (PORCINE) 1000 UNIT/ML IJ SOLN
INTRAMUSCULAR | Status: DC | PRN
  Administered 2017-06-15: 5000 [IU] via INTRAVENOUS
  Administered 2017-06-15: 3000 [IU] via INTRAVENOUS

## 2017-06-15 MED ORDER — SODIUM CHLORIDE 0.9 % IV SOLN: INTRAVENOUS | Status: AC

## 2017-06-15 MED ORDER — NITROGLYCERIN 0.4 MG SL SUBL
0.4000 mg | SUBLINGUAL_TABLET | SUBLINGUAL | Status: DC | PRN
Start: 2017-06-15 — End: 2017-06-15
  Administered 2017-06-15 (×2): 0.4 mg via SUBLINGUAL

## 2017-06-15 MED ORDER — HEPARIN (PORCINE) IN NACL 2-0.9 UNIT/ML-% IJ SOLN
INTRAMUSCULAR | Status: AC
  Filled 2017-06-15: qty 1000

## 2017-06-15 MED ORDER — NITROGLYCERIN 1 MG/10 ML FOR IR/CATH LAB
INTRA_ARTERIAL | Status: AC
  Filled 2017-06-15: qty 10

## 2017-06-15 MED ORDER — PNEUMOCOCCAL VAC POLYVALENT 25 MCG/0.5ML IJ INJ: 0.5000 mL | INJECTION | INTRAMUSCULAR | Status: DC

## 2017-06-15 MED ORDER — CLOPIDOGREL BISULFATE 300 MG PO TABS
300.0000 mg | ORAL_TABLET | Freq: Once | ORAL | Status: AC
  Administered 2017-06-15: 300 mg via ORAL
  Filled 2017-06-15: qty 1

## 2017-06-15 MED ORDER — ONDANSETRON HCL 4 MG/2ML IJ SOLN
4.0000 mg | Freq: Once | INTRAMUSCULAR | Status: AC
  Administered 2017-06-15: 4 mg via INTRAVENOUS
  Filled 2017-06-15: qty 2

## 2017-06-15 MED ORDER — LIDOCAINE HCL (PF) 1 % IJ SOLN
INTRAMUSCULAR | Status: DC | PRN
  Administered 2017-06-15: 2 mL

## 2017-06-15 MED ORDER — HEPARIN (PORCINE) IN NACL 100-0.45 UNIT/ML-% IJ SOLN: 900.0000 [IU]/h | INTRAMUSCULAR | Status: DC

## 2017-06-15 MED ORDER — FENTANYL CITRATE (PF) 100 MCG/2ML IJ SOLN
INTRAMUSCULAR | Status: DC | PRN
  Administered 2017-06-15 (×2): 25 ug via INTRAVENOUS

## 2017-06-15 MED ORDER — ONDANSETRON HCL 4 MG/2ML IJ SOLN: 4.0000 mg | Freq: Four times a day (QID) | INTRAMUSCULAR | Status: DC | PRN

## 2017-06-15 MED ORDER — IOPAMIDOL (ISOVUE-370) INJECTION 76%
INTRAVENOUS | Status: DC | PRN
  Administered 2017-06-15: 110 mL

## 2017-06-15 MED ORDER — NITROGLYCERIN 0.4 MG SL SUBL
0.4000 mg | SUBLINGUAL_TABLET | SUBLINGUAL | Status: DC | PRN
  Administered 2017-06-16 (×2): 0.4 mg via SUBLINGUAL
  Filled 2017-06-15: qty 1

## 2017-06-15 MED ORDER — ASPIRIN 81 MG PO CHEW
324.0000 mg | CHEWABLE_TABLET | Freq: Once | ORAL | Status: AC
  Administered 2017-06-15: 324 mg via ORAL
  Filled 2017-06-15: qty 4

## 2017-06-15 MED ORDER — SODIUM CHLORIDE 0.9% FLUSH
3.0000 mL | INTRAVENOUS | Status: DC | PRN
  Administered 2017-06-17: 3 mL via INTRAVENOUS
  Filled 2017-06-15: qty 3

## 2017-06-15 MED ORDER — ACETAMINOPHEN 325 MG PO TABS
650.0000 mg | ORAL_TABLET | ORAL | Status: DC | PRN
  Administered 2017-06-16 – 2017-06-18 (×4): 650 mg via ORAL
  Filled 2017-06-15 (×4): qty 2

## 2017-06-15 MED ORDER — ASPIRIN EC 81 MG PO TBEC
81.0000 mg | DELAYED_RELEASE_TABLET | Freq: Every day | ORAL | Status: DC
  Administered 2017-06-16 – 2017-06-19 (×4): 81 mg via ORAL
  Filled 2017-06-15 (×4): qty 1

## 2017-06-15 MED ORDER — SODIUM CHLORIDE 0.9 % IV SOLN: 250.0000 mL | INTRAVENOUS | Status: DC | PRN

## 2017-06-15 MED ORDER — ATORVASTATIN CALCIUM 80 MG PO TABS
80.0000 mg | ORAL_TABLET | Freq: Every day | ORAL | Status: DC
  Administered 2017-06-16 – 2017-06-18 (×3): 80 mg via ORAL
  Filled 2017-06-15 (×3): qty 1

## 2017-06-15 MED ORDER — VERAPAMIL HCL 2.5 MG/ML IV SOLN
INTRAVENOUS | Status: AC
  Filled 2017-06-15: qty 2

## 2017-06-15 MED ORDER — MIDAZOLAM HCL 2 MG/2ML IJ SOLN
INTRAMUSCULAR | Status: AC
  Filled 2017-06-15: qty 2

## 2017-06-15 MED ORDER — NITROGLYCERIN IN D5W 200-5 MCG/ML-% IV SOLN
INTRAVENOUS | Status: AC
  Filled 2017-06-15: qty 250

## 2017-06-15 SURGICAL SUPPLY — 17 items
CATH LAUNCHER 6FR EBU3.5 (CATHETERS) ×2 IMPLANT
CATH MICROCATH NAVVUS (MICROCATHETER) ×1 IMPLANT
CATH OPTITORQUE TIG 4.0 5F (CATHETERS) ×2 IMPLANT
DEVICE RAD COMP TR BAND LRG (VASCULAR PRODUCTS) ×2 IMPLANT
ELECT DEFIB PAD ADLT CADENCE (PAD) ×2 IMPLANT
GLIDESHEATH SLEND A-KIT 6F 22G (SHEATH) ×2 IMPLANT
GLIDESHEATH SLEND SS 6F .021 (SHEATH) IMPLANT
GUIDEWIRE INQWIRE 1.5J.035X260 (WIRE) ×1 IMPLANT
INQWIRE 1.5J .035X260CM (WIRE) ×2
KIT ENCORE 26 ADVANTAGE (KITS) ×2 IMPLANT
KIT HEART LEFT (KITS) ×2 IMPLANT
MICROCATHETER NAVVUS (MICROCATHETER) ×2
PACK CARDIAC CATHETERIZATION (CUSTOM PROCEDURE TRAY) ×2 IMPLANT
TRANSDUCER W/STOPCOCK (MISCELLANEOUS) ×2 IMPLANT
TUBING CIL FLEX 10 FLL-RA (TUBING) ×2 IMPLANT
VALVE GUARDIAN II ~~LOC~~ HEMO (MISCELLANEOUS) ×2 IMPLANT
WIRE ASAHI PROWATER 180CM (WIRE) ×2 IMPLANT

## 2017-06-15 NOTE — ED Notes (Signed)
Pt and PTT were done at 1730 not 1710 as down on chart

## 2017-06-15 NOTE — ED Notes (Signed)
Family at bedside. 

## 2017-06-15 NOTE — Progress Notes (Signed)
ANTICOAGULATION CONSULT NOTE - Initial Consult  Pharmacy Consult for heparin Indication: chest pain/ACS  No Known Allergies  Patient Measurements: Height: 5\' 1"  (154.9 cm) Weight: 220 lb (99.8 kg) IBW/kg (Calculated) : 47.8 Heparin Dosing Weight: 71.8 kg  Vital Signs: Temp: 98.2 F (36.8 C) (08/09 1755) Temp Source: Oral (08/09 1755) BP: 148/92 (08/09 1755) Pulse Rate: 68 (08/09 1755)  Labs:  Recent Labs  06/15/17 1550 06/15/17 1710  HGB 14.1  --   HCT 40.2  --   PLT 233  --   CREATININE  --  0.93  TROPONINI 1.03*  --     Estimated Creatinine Clearance: 81 mL/min (by C-G formula based on SCr of 0.93 mg/dL).   Medical History: History reviewed. No pertinent past medical history.  Assessment: 46 y/o female presented to ED with chest pain x 2 weeks, not improved by ibuprofen. Pharmacy consulted for heparin dosing. [facility]. No PTA anticoagulation noted, CBC wnl, no bleeding noted.  Goal of Therapy:  Heparin level 0.3-0.7 units/ml Monitor platelets by anticoagulation protocol: Yes   Plan:  Heparin bolus 4000 units x 1 (already given per main pharmacy recs) Start heparin infusion at 900 units/hr Check heparin level in 6 hours Monitor daily heparin level, CBC, s/sx of bleeding   Nancy Ayers, PharmD PGY1 [facility] Resident Pager: [phone] 06/15/2017,6:11 PM

## 2017-06-15 NOTE — ED Notes (Signed)
ED Provider at bedside. 

## 2017-06-15 NOTE — Progress Notes (Signed)
Critical value: troponin 1.09 at 21:27 (up from 1.03 at 15:50). Expected trend, will continue to monitor.

## 2017-06-15 NOTE — Progress Notes (Signed)
Pt arrived to unit on nitroglycerin drip at . Pt's SBP now 158. Nitro gtt order not current, so Cards fellow text-paged for new orders. Pt reports no CP at this time.  Will continue to monitor closely.

## 2017-06-15 NOTE — ED Triage Notes (Signed)
Chest pain x 2 weeks. Pain is right upper quadrant pain. Aching all over today when she woke up. She took Ibuprofen with no improvement. Tingling in her hands since yesterday.

## 2017-06-15 NOTE — ED Notes (Signed)
Patient on the cardiac monitor set for every 30 minutes.

## 2017-06-15 NOTE — Progress Notes (Signed)
ANTICOAGULATION CONSULT NOTE   Pharmacy Consult for heparin Indication: chest pain/ACS  No Known Allergies  Patient Measurements: Height: 5\' 1"  (154.9 cm) Weight: 220 lb (99.8 kg) IBW/kg (Calculated) : 47.8 Heparin Dosing Weight: 71.8 kg  Vital Signs: Temp: 98.2 F (36.8 C) (08/09 1755) Temp Source: Oral (08/09 1755) BP: 138/84 (08/09 1956) Pulse Rate: 71 (08/09 1956)  Labs:  Recent Labs  06/15/17 1550 06/15/17 1710  HGB 14.1  --   HCT 40.2  --   PLT 233  --   CREATININE  --  0.93  TROPONINI 1.03*  --     Estimated Creatinine Clearance: 81 mL/min (by C-G formula based on SCr of 0.93 mg/dL).   Medical History: History reviewed. No pertinent past medical history.  Assessment: 46 y/o female presented to ED with chest pain x 2 weeks and she is now s/p cath to begin heparin 8 hours post sheath removal (radial approach; sheath removed ~ 8pm). Plans noted for a minimum of 48 hours heparin.  -prior to cath heparin was running at 900 units/hr (no heparin levels available)  Goal of Therapy:  Heparin level 0.3-0.7 units/ml Monitor platelets by anticoagulation protocol: Yes   Plan:  -restart heparin at 4am on 8/10 -Heparin level in 6 hours and daily wth CBC daily  Harland GermanAndrew Sylvain Hasten, Pharm D 06/15/2017 8:25 PM

## 2017-06-15 NOTE — ED Provider Notes (Signed)
[facility]-EMERGENCY DEPT Provider Note   CSN: [id number] Arrival date & time: 06/15/17  1434     History   Chief Complaint Chief Complaint  Patient presents with  . Chest Pain    HPI Nancy Ayers is a 46 y.o. female presenting with 2 week history of intermittent right upper quadrant abdominal pain.  Patient states that for the past 2 weeks, she's had intermittent sharp pain in the right upper quadrant that radiates to her back and into the middle of her stomach. When this happens, she has associated nausea. She has found transient relief of her symptoms with famotidine. She denies association with oral intake or movement. She reports her appetite has been normal.  Yesterday, she started feeling worse and reports a feeling of generalized weakness and body aches. Last night she started to have pain that traveled to her R chest, and since then has moved centrally. She describes it as a tightness. She took ibuprofen without relief of the pain.She denies fever, chills, shortness of breath, cough, urinary symptoms, or abnormal bowel movements. She denies dizziness, vision changes, diaphoresis, or numbness. She reports bilateral palm sensation of tingling/tightness. She denies any other medical problems, does not take any medications on daily basis. She reports increased stress recently as she is applying for jobs. Pt is a current smoker. She denies cardiac history or FmHx of cardiac events.  HPI  History reviewed. No pertinent past medical history.  Patient Active Problem List   Diagnosis Date Noted  . Non-ST elevation (NSTEMI) myocardial infarction (HCC) 06/15/2017  . NSTEMI (non-ST elevated myocardial infarction) (HCC) 06/15/2017    Past Surgical History:  Procedure Laterality Date  . KNEE SURGERY    . KNEE SURGERY    . TUBAL LIGATION      OB History    No data available       Home Medications    Prior to Admission medications   Medication Sig Start Date End Date Taking?  Authorizing Provider  nitrofurantoin, macrocrystal-monohydrate, (MACROBID) 100 MG capsule Take 1 capsule (100 mg total) by mouth 2 (two) times daily. 07/08/16   Long, Arlyss Repress, MD  ondansetron (ZOFRAN) 4 MG tablet Take 1 tablet (4 mg total) by mouth every 6 (six) hours. 07/08/16   Long, Arlyss Repress, MD    Family History No family history on file.  Social History Social History  Substance Use Topics  . Smoking status: Current Every Day Smoker    Packs/day: 0.50    Types: Cigarettes  . Smokeless tobacco: Never Used  . Alcohol use Yes     Comment: occasional     Allergies   Patient has no known allergies.   Review of Systems Review of Systems  Respiratory: Positive for chest tightness.   Gastrointestinal: Positive for abdominal pain, nausea and vomiting.  All other systems reviewed and are negative.    Physical Exam Updated Vital Signs BP 127/85   Pulse 71   Temp 98.2 F (36.8 C) (Oral)   Resp 15   Ht 5\' 1"  (1.549 m)   Wt 102.6 kg (226 lb 3.1 oz)   LMP 06/23/2016   SpO2 98%   BMI 42.74 kg/m   Physical Exam  Constitutional: She is oriented to person, place, and time. She appears well-developed and well-nourished. No distress.  HENT:  Head: Normocephalic and atraumatic.  Mouth/Throat: Uvula is midline, oropharynx is clear and moist and mucous membranes are normal.  Eyes: Pupils are equal, round, and reactive to light. EOM are normal.  Neck: Normal range of motion. Neck supple.  Cardiovascular: Normal rate, regular rhythm and intact distal pulses.   Pulmonary/Chest: Effort normal and breath sounds normal. No respiratory distress. She has no wheezes. She exhibits tenderness.  Tenderness to palpation of sternal chest and right-sided chest. No tenderness to palpation of the left side of the chest.  Abdominal: Soft. Bowel sounds are normal. She exhibits no distension. There is tenderness in the right upper quadrant. There is positive Murphy's sign. There is no rigidity, no  rebound, no guarding and no tenderness at McBurney's point.  Minimal tenderness to palpation of right upper quadrant with positive Murphy sign. No tenderness elsewhere in the abdomen, bowel sounds normoactive 4.  Musculoskeletal: Normal range of motion.  Lymphadenopathy:    She has no cervical adenopathy.  Neurological: She is alert and oriented to person, place, and time.  Skin: Skin is warm and dry.  Psychiatric: She has a normal mood and affect.  Nursing note and vitals reviewed.    ED Treatments / Results  Labs (all labs ordered are listed, but only abnormal results are displayed) Labs Reviewed  CBC - Abnormal; Notable for the following:       Result Value   RBC 5.54 (*)    MCV 72.6 (*)    MCH 25.5 (*)    RDW 18.4 (*)    All other components within normal limits  TROPONIN I - Abnormal; Notable for the following:    Troponin I 1.03 (*)    All other components within normal limits  COMPREHENSIVE METABOLIC PANEL - Abnormal; Notable for the following:    Calcium 8.6 (*)    All other components within normal limits  MRSA PCR SCREENING  LIPASE, BLOOD  LIPASE, BLOOD  PROTIME-INR  APTT  HEPARIN LEVEL (UNFRACTIONATED)  CBC  HIV ANTIBODY (ROUTINE TESTING)  TSH  TROPONIN I  TROPONIN I  TROPONIN I  HEMOGLOBIN A1C  BASIC METABOLIC PANEL  CBC  LIPID PANEL    EKG  EKG Interpretation  Date/Time:  Thursday June 15 2017 17:19:35 EDT Ventricular Rate:  75 PR Interval:    QRS Duration: 91 QT Interval:  406 QTC Calculation: 454 R Axis:   -15 Text Interpretation:  Sinus rhythm Probable left atrial enlargement Borderline left axis deviation borderline ST elevation, consider inferior injury Confirmed by Tilden Fossaees, Elizabeth [phone]([id number]) on 06/15/2017 9:42:41 PM       Radiology [place] Abdomen Limited  Result Date: 06/15/2017 CLINICAL DATA:  Right upper quadrant abdominal pain and chest pain. EXAM: ULTRASOUND ABDOMEN LIMITED RIGHT UPPER QUADRANT COMPARISON:  CT of the abdomen without  contrast and right upper quadrant ultrasound at [facility] on 03/15/2012 FINDINGS: Gallbladder: No gallstones or wall thickening visualized. No sonographic Murphy sign noted by sonographer. Common bile duct: Diameter: Normal caliber of 3 mm. Liver: The liver demonstrates coarse echotexture and increased echogenicity, likely reflecting steatosis. No overt cirrhotic contour abnormalities. There is no evidence of intrahepatic biliary ductal dilatation. The portal vein is open. Vague hypoechoic rounded area in the right lobe of the liver measures approximately 1.4 x 1.3 x 1.2 cm. This is not definitively cystic and may represent a subtle solid lesion. There are some smaller hypoechoic areas identified which appear to represent benign cysts. Two separate left lobe cysts measure approximately 0.7 cm and 0.8 cm in greatest diameter respectively. IMPRESSION: 1. Normal gallbladder. 2. Hepatic steatosis. 3. 1.4 cm hypoechoic rounded area in the right lobe is not definitively a benign cyst by ultrasound. There are some  other clearly benign subcentimeter cysts in the left lobe. Recommend correlation with CT of the abdomen with and without contrast utilizing a liver mass protocol. Electronically Signed   By: Irish Lack M.D.   On: 06/15/2017 17:39    Procedures Procedures (including critical care time)  Medications Ordered in ED Medications  sodium chloride 0.9 % bolus 1,000 mL (1,000 mLs Intravenous New Bag/Given 06/15/17 1620)    Followed by  0.9 %  sodium chloride infusion (not administered)  aspirin EC tablet 81 mg (not administered)  nitroGLYCERIN (NITROSTAT) SL tablet 0.4 mg (not administered)  acetaminophen (TYLENOL) tablet 650 mg (not administered)  ondansetron (ZOFRAN) injection 4 mg (not administered)  atorvastatin (LIPITOR) tablet 80 mg (not administered)  acetaminophen (TYLENOL) tablet 650 mg (not administered)  0.9 %  sodium chloride infusion ( Intravenous Rate/Dose Verify 06/15/17 2100)    sodium chloride flush (NS) 0.9 % injection 3 mL (not administered)  sodium chloride flush (NS) 0.9 % injection 3 mL (not administered)  0.9 %  sodium chloride infusion (not administered)  morphine 4 MG/ML injection 2 mg (not administered)  clopidogrel (PLAVIX) tablet 75 mg (not administered)  heparin ADULT infusion 100 units/mL (25000 units/214mL sodium chloride 0.45%) (not administered)  ondansetron (ZOFRAN) injection 4 mg (4 mg Intravenous Given 06/15/17 1619)  ketorolac (TORADOL) 30 MG/ML injection 30 mg (30 mg Intravenous Given 06/15/17 1619)  aspirin chewable tablet 324 mg (324 mg Oral Given 06/15/17 1718)  heparin bolus via infusion 4,000 Units (4,000 Units Intravenous Bolus from Bag 06/15/17 1810)  0.9 %  sodium chloride infusion (  Stopping Infusion hung by another clincian 06/15/17 2030)  clopidogrel (PLAVIX) tablet 300 mg (300 mg Oral Given 06/15/17 2113)     Initial Impression / Assessment and Plan / ED Course  I have reviewed the triage vital signs and the nursing notes.  Pertinent labs & imaging results that were available during my care of the patient were reviewed by me and considered in my medical decision making (see chart for details).     Patient presenting with 2 week history of right upper quadrant pain which is intermittent and colicky. Last night, pain spread to the right side of her chest, and since has spread to his substernal area. She describes as a tightness. Physical exam showed mild tenderness in the right upper quadrant was with positive Murphy sign and tenderness of the right side and central chest. No tenderness to palpation of the left chest. Physical exam otherwise reassuring. Initial EKG showed abnormal changes. Will order basic abd labs, troponin, abd ultrasound, and start IV fluids, give Zofran for nausea, ketorolac for pain.  Initial troponin concerning with elevation to 1.03. Discussed case with attending, and Dr. Madilyn Hook evaluated the patient. Patient is currently  in ultrasound, and reporting no pain at this time. CBC reassuring. CMP hemolyzed and will be redrawn. Will do repeat EKG, give 325 chewable aspirin, and reassess.  Repeat EKG showed concerning changes. At this time, does not meet criteria for STEMI, but shows signs of progression of ischemia. Patient reports return of pain, and given 2 sublingual nitroglycerin. Pain remains at a 7 out of 10, will start nitro drip. Heparin bolus given, and consult cardiology made.  Dr. Madilyn Hook discussed case with cardiology, and patient to be transferred to the Cath Lab. Will start on heparin drip. Discussed findings and plan with patient, and patient is agreeable to plan.  Additionally, abdominal ultrasound showed 1.4 cm lesion of the liver with multiple benign cysts, and  recommends CT abdomen with and without contrast for further evaluation. This has not been discussed with the patient, as higher priority was placed on her chest pain and evolving EKG.   CC Time: 30 Minutes  Final Clinical Impressions(s) / ED Diagnoses   Final diagnoses:  ACS (acute coronary syndrome) [facility])    New Prescriptions Current Discharge Medication List       Alveria Apley, Cordelia Poche 06/15/17 2235    Tilden Fossa, MD 06/17/17 [phone]

## 2017-06-15 NOTE — Brief Op Note (Signed)
   BRIEF CATH NOTE  06/15/2017  8:09 PM  PATIENT:  Nancy Ayers  45 y.o. female  PRE-OPERATIVE DIAGNOSIS:  unstable angina / NonSTEMI  POST-OPERATIVE DIAGNOSIS:   Left Main: Large-caliber vessel that bifurcates into the LAD and Circumflex (miniscule ramus intermedius branch)   LAD is a wraparound vessel with bifurcation branch at the apex.  60-70% pLAD lesion @ D1 -- FFR 0.87 - not physiologically significant; apical LAD branch 100% occluded (suspect distal embolization from the proximal lesion).  Large-caliber Circumflex with a large lateral OM1 and a large left posterolateral branch after giving off the AV groove Circumflex with atrial branch.  RCA (dominant): Large-caliber vessel with very high bifurcation into a tapering PDA and PAVB with 3 small PLV branches  Normal-sized LV with hyperdynamic function.  PROCEDURE:  Procedure(s): LEFT HEART CATH AND CORONARY ANGIOGRAPHY (N/A) INTRAVASCULAR PRESSURE WIRE/FFR STUDY (N/A)  SURGEON:  Surgeon(s) and Role:    * ,  W, MD - Primary  ANESTHESIA:   local and IV sedation; subcutaneous lidocaine 3 mL, 2 Versed, 50 mg fentanyl  EBL:  < 50 mL  LHC/Cor Angio: 6 French right radial sheath, Seldinger technique with micropuncture Angiocath Kit - radial cocktail (10 mL saline with 3 mg verapamil   5 French TIG 4.0 catheter: RCA cineangiography, LV hemodynamics and LV gram  6 French EBU 3.5 Guide Catheter: LCA cineangiography; --> moderate severe appearing lesion in the proximal LAD at D1, decision was made to perform FFR (is also distal embolization to the apical LAD likely from this site)  Additional boluses IV heparin administered to achieve an ACT > 300 Sec  FFR: Pro-water wire, Navvus Microcatheter (Acist) FFR Catheter; adenosine infused at 140 g/kg/min for 2:15 min  Pre: FFR 0.96, final FFR 0.87 (not signficant  Guide catheter removed completely out of the body over wire  MEDICATIONS USED: Contrast: 110 mL,  heparin total 8000 units, 3 mg verapamil (radial cocktail)  COUNTS:  YES  TOURNIQUET:  TR band: 1955 hrs., 11 mL air  DICTATION: .Note written in EPIC  PLAN OF CARE: Admit to inpatient  - continue IV nitroglycerin and IV heparin overnight. Will dose with oral Plavix.  Continue IV heparin for 48 hours minimum  Wean IV nitroglycerin overnight  Would treat her cardiovascular risk stratification with statin and possibly beta blocker as well as Plavix for minimum 3 months  PATIENT DISPOSITION:  PACU - hemodynamically stable.   Delay start of Pharmacological VTE agent (>24hrs) due to surgical blood loss or risk of bleeding: no    , MD   

## 2017-06-15 NOTE — Progress Notes (Signed)
Cards fellow called back, said high BP likely stress-induced/situational, gave orders to titrate off the nitroglycerin gtt and continue monitoring BP and any CP.

## 2017-06-15 NOTE — H&P (Addendum)
Cardiology Admission History and Physical:   Patient ID: Nancy Ayers; MRN: [id number]; DOB: Jan 15, 1971   Admission date: 06/15/2017  Primary Care Provider: Patient, No Pcp Per Primary Cardiologist: New to Dr. Ellyn Hack   Chief Complaint:  Chest pain   Patient Profile:   Nancy Ayers is a 46 y.o. female with No past medical history presented to [facility] for evaluation of right upper quadrant abdominal pain and chest pain and found to have a non-STEMI and transferred to [facility] for cath. Upon arrival, her CP was ~7/10.  History of Present Illness:   [name] has intermittent sharp pain in the right upper quadrant radiating to her mid stomach/substernal area. She stated with the nausea. Symptoms somewhat improved with famotidine. She also had right-sided chest pain with movement. Described as a tightness and came to ER for further evaluation. EKG shows new ST segment changes in inferior lead progressing to ischemia- personally reviewed. Troponin I 1.3. Symptoms somewhat remained 7 out of 10 after nitroglycerin sublingually. Started on IV heparin, IV nitroglycerin and [facility].    Limited abdominal ultrasound as below 1. Normal gallbladder. 2. Hepatic steatosis. 3. 1.4 cm hypoechoic rounded area in the right lobe is not definitively a benign cyst by ultrasound. There are some other clearly benign subcentimeter cysts in the left lobe. Recommend correlation with CT of the abdomen with and without contrast utilizing a liver mass protocol.  Past Medical History:  Diagnosis Date  . Anxiety    pt takes PRN seroquel  . Depression      She has been treated for Depression & ? H/o Migraines  Past Surgical History:  Procedure Laterality Date  . KNEE SURGERY    . KNEE SURGERY    . TUBAL LIGATION      s/p hysteroscopy/D&C/EM ablation on 10/13/16 secondary to menorrhagia   Medications Prior to Admission: Prior to Admission medications     Medication Sig Start Date End Date Taking? Authorizing Provider  nitrofurantoin, macrocrystal-monohydrate, (MACROBID) 100 MG capsule Take 1 capsule (100 mg total) by mouth 2 (two) times daily. 07/08/16   Long, Wonda Olds, MD  ondansetron (ZOFRAN) 4 MG tablet Take 1 tablet (4 mg total) by mouth every 6 (six) hours. 07/08/16   Long, Wonda Olds, MD     Allergies:   No Known Allergies  Social History:   Social History   Social History  . Marital status: Single    Spouse name: N/A  . Number of children: N/A  . Years of education: N/A   Occupational History  . unemployed     currently looking for work   Social History Main Topics  . Smoking status: Current Every Day Smoker    Packs/day: 0.50    Types: Cigarettes  . Smokeless tobacco: Never Used  . Alcohol use Yes     Comment: occasional (less than 1x/week)  . Drug use: Yes    Types: Marijuana     Comment: last time 2 weeks ago  . Sexual activity: Yes    Birth control/ protection: Surgical   Other Topics Concern  . Not on file   Social History Narrative   Single. Has relationship x 7+ yrs. Has 2 children ages 42 and 4. Also has an adoptive son that is 82. Daughter, Significant other and grandson ive in the home. Dog in the home. Drive and has own transportation. Works at [employer]. Sits most of the day. Watches tv and likes get  togethers.[employer] music and to dance. Smokes 1/2 ppd for 26 years. Drinks on the weekends. May have (2) 24 oz cans. Smokes mariajuana 5/7 days a week.     Family History:   The patient's family history includes Diabetes in her sister; Lung cancer in her father; Peripheral vascular disease in her mother; Sickle cell anemia in her brother.   - Unable to obtain history as patient is in cath lab - her mother has "heart disease" (she does not know details).  Her sister has had a heart catheterization, but she is not aware of stents. Father has HTN.  ROS:  Please see the history of present illness. Pertinent +/-  noted . All other ROS reviewed and negative.     Physical Exam/Data:   Vitals:   06/15/17 2200 06/15/17 2300 06/16/17 0000 06/16/17 0100  BP: 127/85 134/81 134/81 (!) 142/78  Pulse: 71 60 60 67  Resp: '15 15 11 17  ' Temp:   98.5 F (36.9 C)   TempSrc:   Oral   SpO2: 98% 97% 100% 99%  Weight:      Height:        Intake/Output Summary (Last 24 hours) at 06/16/17 0156 Last data filed at 06/16/17 0100  Gross per 24 hour  Intake            955.8 ml  Output                0 ml  Net            955.8 ml   Filed Weights   06/15/17 1437 06/15/17 2030  Weight: 220 lb (99.8 kg) 226 lb 3.1 oz (102.6 kg)   Body mass index is 42.74 kg/m.  General:  Well nourished, well developed, in no acute distress HEENT: normal Lymph: no adenopathy Neck: noJVD Endocrine:  No thryomegaly Vascular: No carotid bruits; FA pulses 2+ bilaterally without bruits  Cardiac:  normal S1, S2; RRR; no murmur  Lungs:  clear to auscultation bilaterally, no wheezing, rhonchi or rales  Abd: soft, nontender, no hepatomegaly, mild RUQ tenderness. Ext: noedema Musculoskeletal:  No deformities, BUE and BLE strength normal and equal; some chest wall tenderness.  Skin: warm and dry  Neuro:  CNs 2-12 intact, no focal abnormalities noted Psych:  Normal affect - scared.   Laboratory Data:  Chemistry  Recent Labs Lab 06/15/17 1710  NA 136  K 4.0  CL 102  CO2 26  GLUCOSE 77  BUN 8  CREATININE 0.93  CALCIUM 8.6*  GFRNONAA >60  GFRAA >60  ANIONGAP 8     Recent Labs Lab 06/15/17 1710  PROT 7.6  ALBUMIN 3.7  AST 33  ALT 15  ALKPHOS 84  BILITOT 0.9   Hematology  Recent Labs Lab 06/15/17 1550  WBC 5.9  RBC 5.54*  HGB 14.1  HCT 40.2  MCV 72.6*  MCH 25.5*  MCHC 35.1  RDW 18.4*  PLT 233   Cardiac Enzymes  Recent Labs Lab 06/15/17 1550 06/15/17 2127  TROPONINI 1.03* 1.09*   No results for input(s): TROPIPOC in the last 168 hours.  BNPNo results for input(s): BNP, PROBNP in the last 168  hours.  DDimer No results for input(s): DDIMER in the last 168 hours.  Radiology/Studies:  US Abdomen Limited  Result Date: 06/15/2017 CLINICAL DATA:  Right upper quadrant abdominal pain and chest pain. EXAM: ULTRASOUND ABDOMEN LIMITED RIGHT UPPER QUADRANT COMPARISON:  CT of the abdomen without contrast and right upper quadrant ultrasound at [facility] on 03/15/2012 FINDINGS: Gallbladder: No gallstones or wall thickening visualized. No sonographic Murphy sign noted by sonographer. Common bile duct: Diameter: Normal caliber of 3 mm. Liver: The liver demonstrates coarse echotexture and increased echogenicity, likely reflecting steatosis. No overt cirrhotic contour abnormalities. There is no evidence of intrahepatic biliary ductal dilatation. The portal vein is open. Vague hypoechoic rounded area in the right lobe of the liver measures approximately 1.4 x 1.3 x 1.2 cm. This is not definitively cystic and may represent a subtle solid lesion. There are some smaller hypoechoic areas identified which appear to represent benign cysts. Two separate left lobe cysts measure approximately 0.7 cm and 0.8 cm in greatest diameter respectively. IMPRESSION: 1. Normal gallbladder. 2. Hepatic steatosis. 3. 1.4 cm hypoechoic rounded area in the right lobe is not definitively a benign cyst by ultrasound. There are some other clearly benign subcentimeter cysts in the left lobe. Recommend correlation with CT of the abdomen with and without contrast utilizing a liver mass protocol. Electronically Signed   By: Aletta Edouard M.D.   On: 06/15/2017 17:39    Assessment and Plan:   1. Non-STEMI - Troponin I of 1.03. EKG shows progressive ischemia. Pending cath. Cycle troponin. Check A1c, lipid panel.  2. Abnormal abdominal ultrasound - Concerning for liver mass. He commended abdominal CT. Recommended further workup tomorrow.  Severity of Illness: The appropriate patient status for this patient is INPATIENT. Inpatient  status is judged to be reasonable and necessary in order to provide the required intensity of service to ensure the patient's safety. The patient's presenting symptoms, physical exam findings, and initial radiographic and laboratory data in the context of their chronic comorbidities is felt to place them at high risk for further clinical deterioration. Furthermore, it is not anticipated that the patient will be medically stable for discharge from the hospital within 2 midnights of admission. The following factors support the patient status of inpatient.   " The patient's presenting symptoms include. Abdominal and chest pain  " The worrisome physical exam findings include None  " The initial radiographic and laboratory data are worrisome because of abnormal [place] and positive troponin  " The chronic co-morbidities include None   * I certify that at the point of admission it is my clinical judgment that the patient will require inpatient hospital care spanning beyond 2 midnights from the point of admission due to high intensity of service, high risk for further deterioration and high frequency of surveillance required.*    Signed, [place], PA  06/15/2017 7:32 PM   I have seen, examined and evaluated the patient along with Mr. Curly [place], [place].  After reviewing all the available data and chart, we discussed the patients laboratory, study & physical findings as well as symptoms in detail. I agree with his findings, examination as well as impression recommendations as per our discussion.    Pleasant young woman transferred from [facility] with NSTEMI & on-going pain.  Her EKG does have some subtle biphasic changes in inferior leads, but is not diagnostic for STEMI, however with on-going pain urgent catheterization is warranted.  She was already on the cath lab table when I met her.  Dr. Claiborne Billings assisted me with initial evaluation & discussing the cardiac cath procedure with R/B/A/I. As she was already  draped, verbal consent was given & witnessed by the cath lab staff. Emergency consent implied.  Plan = proceed with cath.  More plan per results.   Glenetta Hew, M.D., M.S. Interventional Cardiologist  Pager # [phone] Phone # [phone] [address] [place], [place] [address]

## 2017-06-16 ENCOUNTER — Encounter (HOSPITAL_COMMUNITY): Payer: Self-pay

## 2017-06-16 DIAGNOSIS — I214 Non-ST elevation (NSTEMI) myocardial infarction: Secondary | ICD-10-CM | POA: Diagnosis not present

## 2017-06-16 LAB — CBC
HCT: 38.7 % (ref 36.0–46.0)
Hemoglobin: 12.8 g/dL (ref 12.0–15.0)
MCH: 24.6 pg — ABNORMAL LOW (ref 26.0–34.0)
MCHC: 33.1 g/dL (ref 30.0–36.0)
MCV: 74.4 fL — ABNORMAL LOW (ref 78.0–100.0)
PLATELETS: 211 10*3/uL (ref 150–400)
RBC: 5.2 MIL/uL — AB (ref 3.87–5.11)
RDW: 16.7 % — AB (ref 11.5–15.5)
WBC: 6.4 10*3/uL (ref 4.0–10.5)

## 2017-06-16 LAB — POCT ACTIVATED CLOTTING TIME: Activated Clotting Time: 279 seconds

## 2017-06-16 LAB — BASIC METABOLIC PANEL
Anion gap: 7 (ref 5–15)
CHLORIDE: 103 mmol/L (ref 101–111)
CO2: 23 mmol/L (ref 22–32)
CREATININE: 0.88 mg/dL (ref 0.44–1.00)
Calcium: 8.6 mg/dL — ABNORMAL LOW (ref 8.9–10.3)
GFR calc non Af Amer: >60 mL/min (ref >60)
Glucose, Bld: 128 mg/dL — ABNORMAL HIGH (ref 65–99)
Potassium: 3.9 mmol/L (ref 3.5–5.1)
SODIUM: 133 mmol/L — AB (ref 135–145)

## 2017-06-16 LAB — MRSA PCR SCREENING: MRSA BY PCR: NEGATIVE

## 2017-06-16 LAB — TROPONIN I
TROPONIN I: 1.12 ng/mL — AB (ref <0.03)
Troponin I: 1.23 ng/mL (ref <0.03)

## 2017-06-16 LAB — LIPID PANEL
CHOLESTEROL: 154 mg/dL (ref 0–200)
HDL: 35 mg/dL — AB (ref >40)
LDL CALC: 86 mg/dL (ref 0–99)
TRIGLYCERIDES: 166 mg/dL — AB (ref <150)
Total CHOL/HDL Ratio: 4.4 RATIO
VLDL: 33 mg/dL (ref 0–40)

## 2017-06-16 LAB — HEPARIN LEVEL (UNFRACTIONATED)
Heparin Unfractionated: 0.15 IU/mL — ABNORMAL LOW (ref 0.30–0.70)
Heparin Unfractionated: 0.36 IU/mL (ref 0.30–0.70)

## 2017-06-16 MED ORDER — ISOSORBIDE MONONITRATE ER 30 MG PO TB24
30.0000 mg | ORAL_TABLET | Freq: Every day | ORAL | Status: DC
  Administered 2017-06-16 – 2017-06-19 (×4): 30 mg via ORAL
  Filled 2017-06-16 (×4): qty 1

## 2017-06-16 MED ORDER — PNEUMOCOCCAL VAC POLYVALENT 25 MCG/0.5ML IJ INJ: 0.5000 mL | INJECTION | INTRAMUSCULAR | Status: DC | PRN

## 2017-06-16 MED ORDER — CARVEDILOL 12.5 MG PO TABS
12.5000 mg | ORAL_TABLET | Freq: Two times a day (BID) | ORAL | Status: DC
  Administered 2017-06-16 – 2017-06-19 (×6): 12.5 mg via ORAL
  Filled 2017-06-16 (×7): qty 1

## 2017-06-16 MED ORDER — METOPROLOL TARTRATE 25 MG PO TABS: 25.0000 mg | ORAL_TABLET | Freq: Two times a day (BID) | ORAL | Status: DC

## 2017-06-16 NOTE — Progress Notes (Signed)
ANTICOAGULATION CONSULT NOTE   Pharmacy Consult for heparin Indication: chest pain/ACS  No Known Allergies  Patient Measurements: Height: 5\' 1"  (154.9 cm) Weight: 224 lb 10.4 oz (101.9 kg) IBW/kg (Calculated) : 47.8 Heparin Dosing Weight: 71.8 kg  Vital Signs: Temp: 98.5 F (36.9 C) (08/10 2013) Temp Source: Oral (08/10 2013) BP: 126/102 (08/10 2000) Pulse Rate: 60 (08/10 2053)  Labs:  Recent Labs  06/15/17 1550 06/15/17 1710 06/15/17 2127 06/16/17 0230 06/16/17 0939 06/16/17 1232 06/16/17 1922  HGB 14.1  --   --   --  12.8  --   --   HCT 40.2  --   --   --  38.7  --   --   PLT 233  --   --   --  211  --   --   HEPARINUNFRC  --   --   --   --   --  0.15* 0.36  CREATININE  --  0.93  --   --  0.88  --   --   TROPONINI 1.03*  --  1.09* 1.23* 1.12*  --   --     Estimated Creatinine Clearance: 86.6 mL/min (by C-G formula based on SCr of 0.88 mg/dL).   Medical History: Past Medical History:  Diagnosis Date  . Anxiety    pt takes PRN seroquel  . Depression     Assessment: [age] yoF presenting with CP now s/p cath to begin on IV heparin for 48 hours for medical management.   Heparin level is therapeutic at 0.36 on 1150 units/hr.  No bleeding reported.   Goal of Therapy:  Heparin level 0.3-0.7 units/ml Monitor platelets by anticoagulation protocol: Yes   Plan:  Continue heparin to 1150 units/hr Monitor heparin level, CBC, S/Sx bleeding daily  Link SnufferJessica Demaria Deeney, PharmD, BCPS Clinical Pharmacist Clinical phone 06/16/2017 until 11PM - #[phone] After hours, please call [phone] 06/16/2017

## 2017-06-16 NOTE — Progress Notes (Signed)
Chaplain stopped in room to speak with patient about Advanced Directive.  Patient also requested prayer.  Chaplain prayed with patient and left paperwork for Advanced Directive.  Chaplain explained paperwork to patient.  Patient expressed appreciation for Chaplain presence.    06/16/17 1520  Clinical Encounter Type  Visited With Patient and family together  Visit Type Initial;Psychological support;Spiritual support;Social support  Referral From Nurse  Consult/Referral To Chaplain  Spiritual Encounters  Spiritual Needs Prayer

## 2017-06-16 NOTE — Plan of Care (Signed)
Problem: Education: Goal: Understanding of CV disease, CV risk reduction, and recovery process will improve Outcome: Progressing Pt educated on modifiable and non-modifiable risk factors for CV disease.

## 2017-06-16 NOTE — Progress Notes (Signed)
Called lab to clarify when various labs are to be drawn. At 0800 scheduled labs include HIV, CBC, BMet, and troponin. At 1400 a heparin level is scheduled based on when heparin gtt began.  Labs have been clustered as much as possible d/t pt being a difficult stick.

## 2017-06-16 NOTE — Progress Notes (Signed)
ANTICOAGULATION CONSULT NOTE   Pharmacy Consult for heparin Indication: chest pain/ACS  No Known Allergies  Patient Measurements: Height: 5\' 1"  (154.9 cm) Weight: 224 lb 10.4 oz (101.9 kg) IBW/kg (Calculated) : 47.8 Heparin Dosing Weight: 71.8 kg  Vital Signs: Temp: 98.4 F (36.9 C) (08/10 1219) Temp Source: Oral (08/10 1219) BP: 146/92 (08/10 1219) Pulse Rate: 68 (08/10 1219)  Labs:  Recent Labs  06/15/17 1550 06/15/17 1710 06/15/17 2127 06/16/17 0230 06/16/17 0939 06/16/17 1232  HGB 14.1  --   --   --  12.8  --   HCT 40.2  --   --   --  38.7  --   PLT 233  --   --   --  211  --   HEPARINUNFRC  --   --   --   --   --  0.15*  CREATININE  --  0.93  --   --  0.88  --   TROPONINI 1.03*  --  1.09* 1.23* 1.12*  --     Estimated Creatinine Clearance: 86.6 mL/min (by C-G formula based on SCr of 0.88 mg/dL).   Medical History: Past Medical History:  Diagnosis Date  . Anxiety    pt takes PRN seroquel  . Depression     Assessment: [age] yoF presenting with CP now s/p cath to begin on IV heparin for 48 hours for medical management. Heparin resumed on 8/10 at 0400, currently subtherapeutic at 0.15. CBC stable, no overt S/Sx bleeding noted.  Goal of Therapy:  Heparin level 0.3-0.7 units/ml Monitor platelets by anticoagulation protocol: Yes   Plan:  -Increase heparin to 1150 units/hr -Check 6-hour heparin level -Monitor heparin level, CBC, S/Sx bleeding daily  Fredonia HighlandMichael Caelin Rosen, PharmD PGY-2 Cardiology Pharmacy Resident Pager: [phone] 06/16/2017

## 2017-06-16 NOTE — Progress Notes (Signed)
Progress Note  Patient Name: Nancy Ayers Date of Encounter: 06/16/2017  Primary Cardiologist: Dr. Herbie Baltimore ([place])  Subjective   Currently pain free. Right radial cath is stable.   Inpatient Medications    Scheduled Meds: . aspirin EC  81 mg Oral Daily  . atorvastatin  80 mg Oral q1800  . clopidogrel  75 mg Oral Q breakfast  . sodium chloride flush  3 mL Intravenous Q12H   Continuous Infusions: . sodium chloride    . sodium chloride 250 mL (06/16/17 0700)  . heparin 900 Units/hr (06/16/17 0700)   PRN Meds: sodium chloride, acetaminophen, morphine injection, nitroGLYCERIN, ondansetron (ZOFRAN) IV, pneumococcal 23 valent vaccine, sodium chloride flush   Vital Signs    Vitals:   06/16/17 0800 06/16/17 0801 06/16/17 0900 06/16/17 1000  BP: 134/75 134/75 135/72 137/86  Pulse: 71 65 63 69  Resp: 17 14 18 13   Temp:  98.4 F (36.9 C)    TempSrc:  Oral    SpO2: 99% 99% 99% 100%  Weight:      Height:        Intake/Output Summary (Last 24 hours) at 06/16/17 1135 Last data filed at 06/16/17 0900  Gross per 24 hour  Intake          1530.17 ml  Output                0 ml  Net          1530.17 ml   Filed Weights   06/15/17 1437 06/15/17 2030 06/16/17 0500  Weight: 220 lb (99.8 kg) 226 lb 3.1 oz (102.6 kg) 224 lb 10.4 oz (101.9 kg)    Telemetry    NSR - Personally Reviewed  ECG    Normal sinus rhythm Nonspecific T wave abnormality - Personally Reviewed  Physical Exam   GEN: No acute distress.  Moderately obese  Neck: No JVD Cardiac: RRR, no murmurs, rubs, or gallops.  Respiratory: Clear to auscultation bilaterally. GI: Soft, nontender, non-distended  MS: No edema; No deformity. Neuro:  Nonfocal  Psych: Normal affect   Labs    Chemistry Recent Labs Lab 06/15/17 1710 06/16/17 0939  NA 136 133*  K 4.0 3.9  CL 102 103  CO2 26 23  GLUCOSE 77 128*  BUN 8 <5*  CREATININE 0.93 0.88  CALCIUM 8.6* 8.6*  PROT 7.6  --   ALBUMIN 3.7  --   AST 33  --     ALT 15  --   ALKPHOS 84  --   BILITOT 0.9  --   GFRNONAA >60 >60  GFRAA >60 >60  ANIONGAP 8 7     Hematology Recent Labs Lab 06/15/17 1550 06/16/17 0939  WBC 5.9 6.4  RBC 5.54* 5.20*  HGB 14.1 12.8  HCT 40.2 38.7  MCV 72.6* 74.4*  MCH 25.5* 24.6*  MCHC 35.1 33.1  RDW 18.4* 16.7*  PLT 233 211    Cardiac Enzymes Recent Labs Lab 06/15/17 1550 06/15/17 2127 06/16/17 0230 06/16/17 0939  TROPONINI 1.03* 1.09* 1.23* 1.12*   No results for input(s): TROPIPOC in the last 168 hours.   BNPNo results for input(s): BNP, PROBNP in the last 168 hours.   DDimer No results for input(s): DDIMER in the last 168 hours.   Radiology    US Abdomen Limited  Result Date: 06/15/2017 CLINICAL DATA:  Right upper quadrant abdominal pain and chest pain. EXAM: ULTRASOUND ABDOMEN LIMITED RIGHT UPPER QUADRANT COMPARISON:  CT of the abdomen without contrast and  right upper quadrant ultrasound at [facility] on 03/15/2012 FINDINGS: Gallbladder: No gallstones or wall thickening visualized. No sonographic Murphy sign noted by sonographer. Common bile duct: Diameter: Normal caliber of 3 mm. Liver: The liver demonstrates coarse echotexture and increased echogenicity, likely reflecting steatosis. No overt cirrhotic contour abnormalities. There is no evidence of intrahepatic biliary ductal dilatation. The portal vein is open. Vague hypoechoic rounded area in the right lobe of the liver measures approximately 1.4 x 1.3 x 1.2 cm. This is not definitively cystic and may represent a subtle solid lesion. There are some smaller hypoechoic areas identified which appear to represent benign cysts. Two separate left lobe cysts measure approximately 0.7 cm and 0.8 cm in greatest diameter respectively. IMPRESSION: 1. Normal gallbladder. 2. Hepatic steatosis. 3. 1.4 cm hypoechoic rounded area in the right lobe is not definitively a benign cyst by ultrasound. There are some other clearly benign subcentimeter cysts in  the left lobe. Recommend correlation with CT of the abdomen with and without contrast utilizing a liver mass protocol. Electronically Signed   By: Irish Lack M.D.   On: 06/15/2017 17:39    Cardiac Studies   Procedures   INTRAVASCULAR PRESSURE WIRE/FFR STUDY  LEFT HEART CATH AND CORONARY ANGIOGRAPHY  Conclusion     Prox LAD lesion, 65 %stenosed. FFR 0.87. This would suggest that is not currently physiologic be significant. Since it is a bifurcation lesion, I have chosen to treat medically for now with IV heparin.  Dist LAD lesion, 100 %stenosed. Likely thromboembolism from the option lesion  The left ventricular systolic function is normal. The left ventricular ejection fraction is 55-65% by visual estimate.  LV end diastolic pressure is normal.   Moderate to severe proximal LAD lesion of D1 with likely distal embolization from the residual lesion down the distal LAD to the apex.      Patient Profile     Nancy Ayers is a 46 y.o. female with 20 year h/o tobacco abuse and family h/o premature CAD (sister with MI and stents at age 13) who presented to [facility] for evaluation of right upper quadrant abdominal pain and chest pain and found to have a non-STEMI and transferred to [facility] for cath.  Assessment & Plan    1. NSTEMI: troponin peaked at 1.23. LHC 06/15/17 showed Moderate to severe proximal LAD lesion of D1 with likely distal embolization from the residual lesion down the distal LAD to the apex. 60-70% pLAD lesion @ D1 -- FFR 0.87 - not physiologically significant; apical LAD branch 100% occluded (suspect distal embolization from the proximal lesion>> too distal and small for intervention). EF is normal at 55-60% by cath estimate. Plan is for medical therapy. IV heparin x 48 hrs. Recs are for Plavix for a minimum of 3 months. ASA 81 mg. High dose statin added, Lipitor 80 mg. Will also add a BB, lopressor 25 mg BID. Check 2D echo. Right radial cath site is  stable. Renal function also stable.   2. DLD: LDL elevated at 86, HLD low at 35 and TG elevated at 166. Goal LDL is <70 mg/dL. Lipitor 80 mg added. Repeat FLP and HFTs in 6-8 weeks.   3. HTN: BP mild-moderately elevated. HR in the 70s. Recent MI w/ CAD. We will add BB, metoprolol 25 mg BID. Monitor HR on BP. Monitor BP response. Can also later add ACE/ARB if BP allows.   4. Tobacco Abuse: we discussed importance of complete smoking cessation for secondary prevention  of CV events.   5. RUQ Pain: presenting symptom. Pt has concerns regarding her gallbladder. [facility]. Normal gallbladder. Hepatic steatosis. 1.4 cm hypoechoic rounded area in the right lobe is not definitively a benign cyst by ultrasound. There are some otherclearly benign subcentimeter cysts in the left lobe. Recommend correlation with CT of the abdomen with and without contrast utilizing a liver mass protocol. MD to review. This can likely be followed as an outpatient.   Dispo: can likely transfer to telemetry today. D/C in the next 24-28 hrs if pt remains stable.   Signed, Robbie LisBrittainy Simmons, PA-C  06/16/2017, 11:35 AM    Patient seen and examined and history reviewed. Agree with above findings and plan. Patient was doing very well until just before I walked in. She ate [employer] chicken nuggets for lunch. Now having epigastric and upper abdominal pain similar to what she had on admission. She is hypertensive. Some relief with sl Ntg.  Exam is benign Ecg shows NSR with T wave inversion in anterior and inferior leads.  Troponin is trending down.   I personally reviewed her cath films. She has a thrombotic lesion just distal to the first diagonal. There is visible thrombus in the vessel. I estimate the lesion severity to be 75-80%. This is a dynamic lesion. I think it is appropriate to continue aggressive antiplatelet and antithrombotic therapy to resolve thrombus. Although FFR was OK I am  concerned this is a dynamic lesion and if she continues to have symptoms I would have a low threshold for taking her back to the lab and stenting her LAD lesion. We will switch lopressor to Coreg for better rate control. Add Imdur.   Farha Dano Swaz[place], MDFACC 06/16/2017 12:49 PM

## 2017-06-17 ENCOUNTER — Inpatient Hospital Stay (HOSPITAL_COMMUNITY): Payer: 59

## 2017-06-17 DIAGNOSIS — I340 Nonrheumatic mitral (valve) insufficiency: Secondary | ICD-10-CM

## 2017-06-17 LAB — CBC
HEMATOCRIT: 35.5 % — AB (ref 36.0–46.0)
Hemoglobin: 11.5 g/dL — ABNORMAL LOW (ref 12.0–15.0)
MCH: 24.2 pg — ABNORMAL LOW (ref 26.0–34.0)
MCHC: 32.4 g/dL (ref 30.0–36.0)
MCV: 74.7 fL — AB (ref 78.0–100.0)
PLATELETS: 199 10*3/uL (ref 150–400)
RBC: 4.75 MIL/uL (ref 3.87–5.11)
RDW: 16.8 % — AB (ref 11.5–15.5)
WBC: 6.3 10*3/uL (ref 4.0–10.5)

## 2017-06-17 LAB — ECHOCARDIOGRAM COMPLETE
HEIGHTINCHES: 61 in
Weight: 3636.71 oz

## 2017-06-17 LAB — HEPARIN LEVEL (UNFRACTIONATED): HEPARIN UNFRACTIONATED: 0.44 [IU]/mL (ref 0.30–0.70)

## 2017-06-17 LAB — HIV ANTIBODY (ROUTINE TESTING W REFLEX): HIV Screen 4th Generation wRfx: NONREACTIVE

## 2017-06-17 NOTE — Progress Notes (Signed)
ANTICOAGULATION CONSULT NOTE   Pharmacy Consult for heparin Indication: chest pain/ACS  No Known Allergies  Patient Measurements: Height: 5\' 1"  (154.9 cm) Weight: 227 lb 4.7 oz (103.1 kg) IBW/kg (Calculated) : 47.8 Heparin Dosing Weight: 71.8 kg  Vital Signs: Temp: 97.8 F (36.6 C) (08/11 0410) Temp Source: Oral (08/11 0410) BP: 120/67 (08/11 0811) Pulse Rate: 57 (08/11 0811)  Labs:  Recent Labs  06/15/17 1550 06/15/17 1710 06/15/17 2127 06/16/17 0230 06/16/17 0939 06/16/17 1232 06/16/17 1922 06/17/17 0302  HGB 14.1  --   --   --  12.8  --   --  11.5*  HCT 40.2  --   --   --  38.7  --   --  35.5*  PLT 233  --   --   --  211  --   --  199  HEPARINUNFRC  --   --   --   --   --  0.15* 0.36 0.44  CREATININE  --  0.93  --   --  0.88  --   --   --   TROPONINI 1.03*  --  1.09* 1.23* 1.12*  --   --   --     Estimated Creatinine Clearance: 87.2 mL/min (by C-G formula based on SCr of 0.88 mg/dL).   Medical History: Past Medical History:  Diagnosis Date  . Anxiety    pt takes PRN seroquel  . Depression     Assessment: [age] yoF presenting with CP now s/p cath to begin on IV heparin for 48 hours for medical management. The heparin was resumed post cath at 0400 on 8/10.   Heparin level is therapeutic x 2 (0.36 and 0.44) on 1150 units/hr. CBC stable, no bleeding reported.   Goal of Therapy:  Heparin level 0.3-0.7 units/ml Monitor platelets by anticoagulation protocol: Yes   Plan:  Continue heparin to 1150 units/hr Follow-up on discontinuing heparin on 8/12 early AM Monitor heparin level, CBC, S/Sx bleeding daily until discontinued  Adline PotterSabrina Lynzi Meulemans, PharmD Pharmacy Resident Pager: [phone]

## 2017-06-17 NOTE — Progress Notes (Signed)
Subjective:  She has been pain-free since yesterday morning when Dr. SwazilandJordan saw her.  Continues on intravenous heparin.  Has dynamic lesion involving LAD with some thrombus and distal embolization.  Echo is pending at the time of dictation.  Objective:  Vital Signs in the last 24 hours: BP 120/67   Pulse (!) 57   Temp 97.8 F (36.6 C) (Oral)   Resp 16   Ht 5\' 1"  (1.549 m)   Wt 103.1 kg (227 lb 4.7 oz)   LMP 06/23/2016   SpO2 98%   BMI 42.95 kg/m   Physical Exam: Obese black female in no acute distress Lungs:  Clear Cardiac:  Regular rhythm, normal S1 and S2, no S3 Extremities:  Radial cath site clean and dry   Intake/Output from previous day: 08/10 0701 - 08/11 0700 In: 1471.2 [P.O.:1200; I.V.:271.2] Out: -   Weight Filed Weights   06/16/17 0500 06/17/17 0300 06/17/17 0410  Weight: 101.9 kg (224 lb 10.4 oz) 103.2 kg (227 lb 8.2 oz) 103.1 kg (227 lb 4.7 oz)    Lab Results: Basic Metabolic Panel:  Recent Labs  16/08/9607/09/18 1710 06/16/17 0939  NA 136 133*  K 4.0 3.9  CL 102 103  CO2 26 23  GLUCOSE 77 128*  BUN 8 <5*  CREATININE 0.93 0.88   CBC:  Recent Labs  06/16/17 0939 06/17/17 0302  WBC 6.4 6.3  HGB 12.8 11.5*  HCT 38.7 35.5*  MCV 74.4* 74.7*  PLT 211 199   Cardiac Panel (last 3 results)  Recent Labs  06/15/17 2127 06/16/17 0230 06/16/17 0939  TROPONINI 1.09* 1.23* 1.12*    Telemetry: Personally reviewed.  Normal sinus rhythm  Assessment/Plan:  1.  Non-STEMI with distal embolization of the LAD and moderately severe disease with borderline FFR at bifurcation of the LAD 2.  Obesity 3.  Hypertension  Recommendations:  She is currently pain-free and continues on intravenous heparin.  After 48 hours of heparin we will discontinue this and have cardiac rehabilitation see her and ambulate her and see how she does.  Per Dr. SwazilandJordan if she has recurrent pain would be candidate for intervention of the more proximal LAD lesion.      Darden PalmerW. Spencer  Tilley, Jr.  MD [facility]  06/17/2017, 11:18 AM

## 2017-06-17 NOTE — Progress Notes (Signed)
  Echocardiogram 2D Echocardiogram has been performed.  Nancy Ayers Nancy Ayers 06/17/2017, 11:04 AM

## 2017-06-18 LAB — CBC
HCT: 35.9 % — ABNORMAL LOW (ref 36.0–46.0)
HEMOGLOBIN: 11.6 g/dL — AB (ref 12.0–15.0)
MCH: 24.2 pg — ABNORMAL LOW (ref 26.0–34.0)
MCHC: 32.3 g/dL (ref 30.0–36.0)
MCV: 74.8 fL — ABNORMAL LOW (ref 78.0–100.0)
Platelets: 215 10*3/uL (ref 150–400)
RBC: 4.8 MIL/uL (ref 3.87–5.11)
RDW: 17 % — AB (ref 11.5–15.5)
WBC: 8.1 10*3/uL (ref 4.0–10.5)

## 2017-06-18 LAB — HEPARIN LEVEL (UNFRACTIONATED): HEPARIN UNFRACTIONATED: 0.46 [IU]/mL (ref 0.30–0.70)

## 2017-06-18 LAB — GLUCOSE, CAPILLARY: GLUCOSE-CAPILLARY: 85 mg/dL (ref 65–99)

## 2017-06-18 MED ORDER — ENOXAPARIN SODIUM 40 MG/0.4ML ~~LOC~~ SOLN
40.0000 mg | SUBCUTANEOUS | Status: DC
  Administered 2017-06-18: 40 mg via SUBCUTANEOUS
  Filled 2017-06-18: qty 0.4

## 2017-06-18 NOTE — Progress Notes (Signed)
Subjective:  No complaints of chest discomfort since yesterday.  Headache is better today.  Her mother is up from [place] and states that she will be going back to [place] with her mother.  Objective:  Vital Signs in the last 24 hours: BP 126/83   Pulse 64   Temp 98.3 F (36.8 C) (Oral)   Resp 18   Ht 5\' 1"  (1.549 m)   Wt 102.2 kg (225 lb 3.2 oz)   LMP 06/23/2016   SpO2 98%   BMI 42.55 kg/m   Physical Exam: Obese black female in no acute distress Lungs:  Clear Cardiac:  Regular rhythm, normal S1 and S2, no S3 Extremities:  Radial cath site clean and dry   Intake/Output from previous day: 08/11 0701 - 08/12 0700 In: 276 [I.V.:276] Out: -   Weight Filed Weights   06/17/17 0300 06/17/17 0410 06/18/17 0443  Weight: 103.2 kg (227 lb 8.2 oz) 103.1 kg (227 lb 4.7 oz) 102.2 kg (225 lb 3.2 oz)    Lab Results: Basic Metabolic Panel:  Recent Labs  16/08/9607/09/18 1710 06/16/17 0939  NA 136 133*  K 4.0 3.9  CL 102 103  CO2 26 23  GLUCOSE 77 128*  BUN 8 <5*  CREATININE 0.93 0.88   CBC:  Recent Labs  06/17/17 0302 06/18/17 0212  WBC 6.3 8.1  HGB 11.5* 11.6*  HCT 35.5* 35.9*  MCV 74.7* 74.8*  PLT 199 215   Cardiac Panel (last 3 results)  Recent Labs  06/15/17 2127 06/16/17 0230 06/16/17 0939  TROPONINI 1.09* 1.23* 1.12*    Telemetry: Personally reviewed.  Normal sinus rhythm  Assessment/Plan:  1.  Non-STEMI with distal embolization of the LAD and moderately severe disease with borderline FFR at bifurcation of the LAD 2.  Obesity 3.  Hypertension  Recommendations:  She is currently pain-free.  She has been on heparin since Thursday night.  I will discontinue heparin today and continue medical therapy and let her ambulate in the hall with cardiac rehabilitation.  If no recurrence of chest discomfort possible discharge in the morning.      Darden PalmerW. Spencer Tilley, Jr.  MD [facility]  06/18/2017, 10:49 AM

## 2017-06-19 ENCOUNTER — Telehealth: Payer: Self-pay | Admitting: Student

## 2017-06-19 ENCOUNTER — Encounter (HOSPITAL_COMMUNITY): Payer: Self-pay | Admitting: Cardiology

## 2017-06-19 ENCOUNTER — Other Ambulatory Visit: Payer: Self-pay | Admitting: Cardiology

## 2017-06-19 DIAGNOSIS — I249 Acute ischemic heart disease, unspecified: Secondary | ICD-10-CM

## 2017-06-19 DIAGNOSIS — E7800 Pure hypercholesterolemia, unspecified: Secondary | ICD-10-CM

## 2017-06-19 DIAGNOSIS — E785 Hyperlipidemia, unspecified: Secondary | ICD-10-CM

## 2017-06-19 DIAGNOSIS — F17200 Nicotine dependence, unspecified, uncomplicated: Secondary | ICD-10-CM

## 2017-06-19 DIAGNOSIS — IMO0001 Reserved for inherently not codable concepts without codable children: Secondary | ICD-10-CM

## 2017-06-19 DIAGNOSIS — I2510 Atherosclerotic heart disease of native coronary artery without angina pectoris: Secondary | ICD-10-CM

## 2017-06-19 LAB — GLUCOSE, CAPILLARY
GLUCOSE-CAPILLARY: 101 mg/dL — AB (ref 65–99)
GLUCOSE-CAPILLARY: 89 mg/dL (ref 65–99)

## 2017-06-19 MED ORDER — CARVEDILOL 12.5 MG PO TABS: 12.5000 mg | ORAL_TABLET | Freq: Two times a day (BID) | ORAL | 0 refills | Status: DC

## 2017-06-19 MED ORDER — ISOSORBIDE MONONITRATE ER 30 MG PO TB24: 30.0000 mg | ORAL_TABLET | Freq: Every day | ORAL | 1 refills | Status: DC

## 2017-06-19 MED ORDER — NITROGLYCERIN 0.4 MG SL SUBL: 0.4000 mg | SUBLINGUAL_TABLET | SUBLINGUAL | 2 refills | Status: DC | PRN

## 2017-06-19 MED ORDER — ATORVASTATIN CALCIUM 80 MG PO TABS: 80.0000 mg | ORAL_TABLET | Freq: Every day | ORAL | 0 refills | Status: DC

## 2017-06-19 MED ORDER — ISOSORBIDE MONONITRATE ER 30 MG PO TB24: 30.0000 mg | ORAL_TABLET | Freq: Every day | ORAL | 0 refills | Status: DC

## 2017-06-19 MED ORDER — CLOPIDOGREL BISULFATE 75 MG PO TABS
75.0000 mg | ORAL_TABLET | Freq: Every day | ORAL | 0 refills | Status: DC
Start: 2017-06-20 — End: 2017-07-03

## 2017-06-19 MED ORDER — ASPIRIN 81 MG PO TBEC: 81.0000 mg | DELAYED_RELEASE_TABLET | Freq: Every day | ORAL | Status: AC

## 2017-06-19 MED ORDER — CLOPIDOGREL BISULFATE 75 MG PO TABS: 75.0000 mg | ORAL_TABLET | Freq: Every day | ORAL | 1 refills | Status: DC

## 2017-06-19 MED ORDER — ATORVASTATIN CALCIUM 80 MG PO TABS: 80.0000 mg | ORAL_TABLET | Freq: Every day | ORAL | 1 refills | Status: DC

## 2017-06-19 MED ORDER — CARVEDILOL 12.5 MG PO TABS: 12.5000 mg | ORAL_TABLET | Freq: Two times a day (BID) | ORAL | 3 refills | Status: DC

## 2017-06-19 MED ORDER — NITROGLYCERIN 0.4 MG SL SUBL: 0.4000 mg | SUBLINGUAL_TABLET | SUBLINGUAL | 0 refills | Status: AC | PRN

## 2017-06-19 MED FILL — CARVEDILOL 12.5 MG TABLET: 12.5 | 30 days supply | Qty: 60 | Fill #0

## 2017-06-19 MED FILL — CLOPIDOGREL 75 MG TABLET: 75 | 30 days supply | Qty: 30 | Fill #0

## 2017-06-19 MED FILL — NITROGLYCERIN 0.4 MG TAB SL: 0.4 | 5 days supply | Qty: 25 | Fill #0

## 2017-06-19 MED FILL — ATORVASTATIN 80 MG TABLET: 80 | 30 days supply | Qty: 30 | Fill #0

## 2017-06-19 MED FILL — ISOSORBIDE MN ER 30 MG TAB: 30 | 30 days supply | Qty: 30 | Fill #0

## 2017-06-19 NOTE — Progress Notes (Signed)
CARDIAC REHAB PHASE I   PRE:  Rate/Rhythm: 67 SR  BP:  Sitting: 115/76        SaO2: 98 RA  MODE:  Ambulation: 500 ft   POST:  Rate/Rhythm: 71 SR  BP:  Sitting: 118/74         SaO2: 100 RA  Pt ambulated 500 ft on RA, independent, steady gait, tolerated well with no complaints. Completed MI education. Reviewed risk factors, tobacco cessation (gave pt fake cigarette), MI book, anti-platelet therapy, activity restrictions, ntg, exercise, heart healthy diet and phase 2 cardiac rehab. Pt verbalized understanding, receptive to education. Pt agrees to phase 2 cardiac rehab referral, [facility]. Pt to edge of bed per pt request after walk, call bell within reach.     [phone] Joylene GrapesEmily C Constanza Mincy, RN, BSN 06/19/2017 10:46 AM

## 2017-06-19 NOTE — Telephone Encounter (Signed)
Patient discharge 06/19/17 First TCM outreach should be 06/20/17

## 2017-06-19 NOTE — Discharge Summary (Signed)
Discharge Summary    Patient ID: Nancy Ayers,  MRN: [id number], DOB/AGE: 48-May-1972 48 y.o.  Admit date: 06/15/2017 Discharge date: 06/19/2017  Primary Care Provider: Patient, No Pcp Per Primary Cardiologist: Herbie Baltimore  Discharge Diagnoses    Principal Problem:   Non-ST elevation (NSTEMI) myocardial infarction [facility]) Active Problems:   NSTEMI (non-ST elevated myocardial infarction) (HCC)   Hyperlipidemia   Smoking   CAD (coronary artery disease), native coronary artery   Allergies No Known Allergies  Diagnostic Studies/Procedures    LHC: 06/15/17  Conclusion     Prox LAD lesion, 65 %stenosed. FFR 0.87. This would suggest that is not currently physiologic be significant. Since it is a bifurcation lesion, I have chosen to treat medically for now with IV heparin.  Dist LAD lesion, 100 %stenosed. Likely thromboembolism from the option lesion  The left ventricular systolic function is normal. The left ventricular ejection fraction is 55-65% by visual estimate.  LV end diastolic pressure is normal.   Moderate to severe proximal LAD lesion of D1 with likely distal embolization from the residual lesion down the distal LAD to the apex. At this distribution is waitress distal and small caliber for intervention.  Plan: She'll be admitted to the ICU as she is having active pain.  Continue IV nitroglycerin overnight and wean off as tolerated  IV heparin to start it after sheath removal and run for 48 hours minimum, given non-STEMI  We will start Plavix, and would recommend statin and blood pressure control likely a beta blocker depending on her blood pressures in the morning.  She can likely be fast- tracked pending stable symptoms.  She were to initiate active exertional chest pain after she recovered from this initial MI, it is likely that the LAD lesion as could have healed in a more significant manner. In that setting, would recommend PCI to LAD.   TTE:  06/17/17  Study Conclusions  - Left ventricle: The cavity size was normal. There was mild   concentric hypertrophy. Systolic function was normal. The   estimated ejection fraction was in the range of 60% to 65%. Wall   motion was normal; there were no regional wall motion   abnormalities. Left ventricular diastolic function parameters   were normal. - Aortic valve: There was no regurgitation. - Aortic root: The aortic root was normal in size. - Mitral valve: There was mild regurgitation. - Right ventricle: Systolic function was normal. - Tricuspid valve: There was mild regurgitation. - Pulmonary arteries: Systolic pressure was within the normal   range. PA peak pressure: 32 mm Hg (S). - Inferior vena cava: The vessel was normal in size. - Pericardium, extracardiac: There was no pericardial effusion. _____________   History of Present Illness     Nancy Ayers is a 46yo female with no PMH who presented to [facility] with chest pain, and right upper quad abd pain. Found to have a NSTEMI and transferred to [facility] for further work up. She reported intermittent sharp pain in the right upper quadrant radiating to her mid stomach/substernal area. She stated with the nausea. Symptoms somewhat improved with famotidine. She also had right-sided chest pain with movement. Described as a tightness and came to ER for further evaluation. EKG shows new ST segment changes in inferior lead progressing to ischemia- personally reviewed. Troponin I 1.3. Symptoms somewhat remained 7 out of 10 after nitroglycerin sublingually. Started on IV heparin, IV nitroglycerin and [facility].    Limited abdominal ultrasound as  below 1. Normal gallbladder. 2. Hepatic steatosis. 3. 1.4 cm hypoechoic rounded area in the right lobe is not definitively a benign cyst by ultrasound. There are some other clearly benign subcentimeter cysts in the left lobe. Recommend correlation with CT of the abdomen with and  without contrast utilizing a liver mass protocol.  Hospital Course     Consultants: None  She underwent LHC with Dr. Herbie Baltimore noted above with 100% distal LAD lesion and 65% prox LAD lesion. It was felt that the troponin elevation came from distal lesion but was too small caliber lesion for PCI. Medical therapy was recommended at that time. She was continued on IV heparin for 48 hours post cath. Also started on high dose statin, ASA, plavix and coreg. Imdur 30mg  daily was added with no further episodes of chest pain. She was able to ambulate without chest discomfort. Troponin peaked at 1.23. Follow up echo showed normal EF with no WMA. LDL 86, and Hgb A1c 5.4. Worked well with cardiac rehab post cath. Smoking cessation discussed. Given her LAD lesion, she will be set up for outpatient exercise myoview per Dr. Allyson Sabal.   She was seen by Dr. Allyson Sabal and determined stable for discharge home. Follow up in the office has been arranged. Medications are listed below.   General: Well developed, well nourished, female appearing in no acute distress. Head: Normocephalic, atraumatic.  Neck: Supple without bruits, JVD. Lungs:  Resp regular and unlabored, CTA. Heart: RRR, S1, S2, no S3, S4, or murmur; no rub. Abdomen: Soft, non-tender, non-distended with normoactive bowel sounds. No hepatomegaly. No rebound/guarding. No obvious abdominal masses. Extremities: No clubbing, cyanosis, edema. Distal pedal pulses are 2+ bilaterally. R radial cath site stable without bruising or hematoma Neuro: Alert and oriented X 3. Moves all extremities spontaneously. Psych: Normal affect.  _____________  Discharge Vitals Blood pressure 126/78, pulse 72, temperature 98 F (36.7 C), temperature source Oral, resp. rate 18, height 5\' 1"  (1.549 m), weight 226 lb 4.8 oz (102.6 kg), last menstrual period 06/23/2016, SpO2 98 %.  Filed Weights   06/17/17 0410 06/18/17 0443 06/19/17 0345  Weight: 227 lb 4.7 oz (103.1 kg) 225 lb 3.2 oz  (102.2 kg) 226 lb 4.8 oz (102.6 kg)    Labs & Radiologic Studies    CBC  Recent Labs  06/17/17 0302 06/18/17 0212  WBC 6.3 8.1  HGB 11.5* 11.6*  HCT 35.5* 35.9*  MCV 74.7* 74.8*  PLT 199 215   Basic Metabolic Panel No results for input(s): NA, K, CL, CO2, GLUCOSE, BUN, CREATININE, CALCIUM, MG, PHOS in the last 72 hours. Liver Function Tests No results for input(s): AST, ALT, ALKPHOS, BILITOT, PROT, ALBUMIN in the last 72 hours. No results for input(s): LIPASE, AMYLASE in the last 72 hours. Cardiac Enzymes No results for input(s): CKTOTAL, CKMB, CKMBINDEX, TROPONINI in the last 72 hours. BNP Invalid input(s): POCBNP D-Dimer No results for input(s): DDIMER in the last 72 hours. Hemoglobin A1C No results for input(s): HGBA1C in the last 72 hours. Fasting Lipid Panel No results for input(s): CHOL, HDL, LDLCALC, TRIG, CHOLHDL, LDLDIRECT in the last 72 hours. Thyroid Function Tests No results for input(s): TSH, T4TOTAL, T3FREE, THYROIDAB in the last 72 hours.  Invalid input(s): FREET3 _____________  US Abdomen Limited  Result Date: 06/15/2017 CLINICAL DATA:  Right upper quadrant abdominal pain and chest pain. EXAM: ULTRASOUND ABDOMEN LIMITED RIGHT UPPER QUADRANT COMPARISON:  CT of the abdomen without contrast and right upper quadrant ultrasound at [facility] on 03/15/2012 FINDINGS:  Gallbladder: No gallstones or wall thickening visualized. No sonographic Murphy sign noted by sonographer. Common bile duct: Diameter: Normal caliber of 3 mm. Liver: The liver demonstrates coarse echotexture and increased echogenicity, likely reflecting steatosis. No overt cirrhotic contour abnormalities. There is no evidence of intrahepatic biliary ductal dilatation. The portal vein is open. Vague hypoechoic rounded area in the right lobe of the liver measures approximately 1.4 x 1.3 x 1.2 cm. This is not definitively cystic and may represent a subtle solid lesion. There are some smaller  hypoechoic areas identified which appear to represent benign cysts. Two separate left lobe cysts measure approximately 0.7 cm and 0.8 cm in greatest diameter respectively. IMPRESSION: 1. Normal gallbladder. 2. Hepatic steatosis. 3. 1.4 cm hypoechoic rounded area in the right lobe is not definitively a benign cyst by ultrasound. There are some other clearly benign subcentimeter cysts in the left lobe. Recommend correlation with CT of the abdomen with and without contrast utilizing a liver mass protocol. Electronically Signed   By: Irish LackGlenn  Yamagata M.D.   On: 06/15/2017 17:39   Disposition   Pt is being discharged home today in good condition.  Follow-up Plans & Appointments    Follow-up Information    Ellsworth LennoxStrader, Brittany M, PA-C Follow up on 07/03/2017.   Specialties:  Physician Assistant, Cardiology Why:  at 9:30am for your follow up appt.  Contact information: [address] [place] [id number] [phone]        [facility] Follow up on 06/27/2017.   Specialty:  Cardiology Why:  at 7:45am for your stress test. Please nothing to eat or drink the night prior after midnight. Medications are ok to take.  Contact information: [address] [place] [id number] [phone]         Discharge Instructions    Call MD for:  redness, tenderness, or signs of infection (pain, swelling, redness, odor or green/yellow discharge around incision site)    Complete by:  As directed    Diet - low sodium heart healthy    Complete by:  As directed    Discharge instructions    Complete by:  As directed    Radial Site Care Refer to this sheet in the next few weeks. These instructions provide you with information on caring for yourself after your procedure. Your caregiver may also give you more specific instructions. Your treatment has been planned according to current medical practices, but problems sometimes occur. Call your caregiver if you have  any problems or questions after your procedure. HOME CARE INSTRUCTIONS You may shower the day after the procedure.Remove the bandage (dressing) and gently wash the site with plain soap and water.Gently pat the site dry.  Do not apply powder or lotion to the site.  Do not submerge the affected site in water for 3 to 5 days.  Inspect the site at least twice daily.  Do not flex or bend the affected arm for 24 hours.  No lifting over 5 pounds (2.3 kg) for 5 days after your procedure.  Do not drive home if you are discharged the same day of the procedure. Have someone else drive you.  You may drive 24 hours after the procedure unless otherwise instructed by your caregiver.  What to expect: Any bruising will usually fade within 1 to 2 weeks.  Blood that collects in the tissue (hematoma) may be painful to the touch. It should usually decrease in size and tenderness within 1 to 2 weeks.  SEEK IMMEDIATE MEDICAL CARE IF: You have unusual pain at the radial site.  You have redness, warmth, swelling, or pain at the radial site.  You have drainage (other than a small amount of blood on the dressing).  You have chills.  You have a fever or persistent symptoms for more than 72 hours.  You have a fever and your symptoms suddenly get worse.  Your arm becomes pale, cool, tingly, or numb.  You have heavy bleeding from the site. Hold pressure on the site.   We have added new medications this admission which are included on your discharge paperwork. You have a follow up appt arranged in the office. Also we scheduled you to have a stress test regarding your heart function to follow up on the lesions noted on your cath. Please call if you have any questions. Please keep track of your blood pressures and bring to your follow up appt.   Increase activity slowly    Complete by:  As directed       Discharge Medications   Current Discharge Medication List    START taking these medications   Details  aspirin  EC 81 MG EC tablet Take 1 tablet (81 mg total) by mouth daily. Qty: 30 tablet    atorvastatin (LIPITOR) 80 MG tablet Take 1 tablet (80 mg total) by mouth daily at 6 PM. Qty: 90 tablet, Refills: 1    carvedilol (COREG) 12.5 MG tablet Take 1 tablet (12.5 mg total) by mouth 2 (two) times daily with a meal. Qty: 60 tablet, Refills: 3    clopidogrel (PLAVIX) 75 MG tablet Take 1 tablet (75 mg total) by mouth daily with breakfast. Qty: 90 tablet, Refills: 1    isosorbide mononitrate (IMDUR) 30 MG 24 hr tablet Take 1 tablet (30 mg total) by mouth daily. Qty: 90 tablet, Refills: 1    nitroGLYCERIN (NITROSTAT) 0.4 MG SL tablet Place 1 tablet (0.4 mg total) under the tongue every 5 (five) minutes x 3 doses as needed for chest pain. Qty: 25 tablet, Refills: 2      CONTINUE these medications which have NOT CHANGED   Details  ibuprofen (ADVIL,MOTRIN) 800 MG tablet Take 800 mg by mouth every 8 (eight) hours as needed for mild pain.    sertraline (ZOLOFT) 50 MG tablet Take 50 mg by mouth daily.          Aspirin prescribed at discharge?  Yes High Intensity Statin Prescribed? (Lipitor 40-80mg  or Crestor 20-40mg ): Yes Beta Blocker Prescribed? Yes For EF <40%, was ACEI/ARB Prescribed? No: EF ok ADP Receptor Inhibitor Prescribed? (i.e. Plavix etc.-Includes Medically Managed Patients): Yes For EF <40%, Aldosterone Inhibitor Prescribed? No: EF ok Was EF assessed during THIS hospitalization? Yes Was Cardiac Rehab II ordered? (Included Medically managed Patients): Yes   Outstanding Labs/Studies   FLP/LFTs in 6 weeks if tolerating statin. Exercise myoview as outpatient.   Duration of Discharge Encounter   Greater than 30 minutes including physician time.  Signed, Laverda Page NP-C 06/19/2017, 9:58 AM Agree with note by Laverda Page NP-C  Patient stable for discharge. She had a cardiac catheterization performed 06/15/17 by Dr. Herbie Baltimore revealing a 60-70% hypodense proximal LAD stenosis along  with an occluded apical LAD. Her troponins were low and flat and the 1 range with normal LV function. Dr. Herbie Baltimore performed FFR of the proximal LAD lesion demonstrating it to be not physiologically  Significant (.87) . She's had no recurrent symptoms. She can be discharged home today on antibiotic therapy.  I'm going to get a Myoview stress test as an outpatient and he'll follow-up with Dr. Herbie Baltimore.  Runell Gess, M.D., FACP, [facility], [facility] [address] [place], [place]  [address]  [phone] 06/19/2017 10:21 AM

## 2017-06-19 NOTE — Care Management Note (Addendum)
Case Management Note  Patient Details  Name: Nancy PhillipsWillette Ayers MRN: [id number] Date of Birth: 12/10/1970  Subjective/Objective: Pt presented for NStemi. Pt is without Insurance- Medicaid Pending. CM did call Financial Counseling to speak with patient. Shanda BumpsJessica was able to speak with pt before being d/c. Pt is currently not working at this time. She has support of her mother that lives in [place]. Pt's [place]. [place] in [place]. Pt [facility]- cost too expensive. [facility].95  [facility]. Medications to cost no more than $3.00 for each Rx. Hopefully Medicaid will be approved soon.                    Action/Plan: CM did call the [facility] in regards to cost for each medication. Pt now has written rx's that she can take to Pharmacy. No further needs from CM at this time.   Expected Discharge Date:  06/19/17               Expected Discharge Plan:  Home/Self Care  In-House Referral:  Financial Counselor (Notified on day of d/c. Spoke with Shanda BumpsJessica. )  Discharge planning Services  CM Consult, Medication Assistance, MATCH Program  Post Acute Care Choice:  NA Choice offered to:  NA  DME Arranged:  N/A DME Agency:  NA  HH Arranged:  NA HH Agency:  NA  Status of Service:  Completed, signed off  If discussed at Long Length of Stay Meetings, dates discussed:    Additional Comments:  Gala LewandowskyGraves-Bigelow, Breslyn Abdo Kaye, RN 06/19/2017, 11:21 AM

## 2017-06-19 NOTE — Telephone Encounter (Signed)
New message      TCM appt made on 07-03-17 at 9:30 with [place] Strader per Lillia AbedLindsay

## 2017-06-20 ENCOUNTER — Telehealth (HOSPITAL_COMMUNITY): Payer: Self-pay | Admitting: Student

## 2017-06-20 NOTE — Telephone Encounter (Signed)
Patient contacted regarding discharge from 06/15/2017 - 06/19/2017 (4 days) [facility] Marykay LexHarding, David W, MD  Patient understands to follow up with provider:YES, strader,PA on 07-03-17 at 930AM at [facility] OFFICE. Patient understands discharge instructions? YES Patient understands medications and regiment? YES Patient understands to bring all medications to this visit? YES  Pt states that she has no questions she will bring medication list and come in early

## 2017-06-21 NOTE — Telephone Encounter (Signed)
User: Trina AoGRIFFIN, Keshonna Valvo A Date/time: 06/20/17 2:18 PM  Comment: Called pt and lmsg for her to CB to r/s appt time for myoview.   Context:  Outcome: Left Message  Phone number: [phone] Phone Type: Home Phone  Comm. type: Telephone Call type: Outgoing  Contact: Eli PhillipsWhite, Stela Relation to patient: Self

## 2017-06-27 ENCOUNTER — Ambulatory Visit (HOSPITAL_COMMUNITY)
Admission: RE | Admit: 2017-06-27 | Discharge: 2017-06-27 | Disposition: A | Discharge status: Home / Self Care | Payer: Self-pay | Source: Ambulatory Visit | Attending: Cardiology | Admitting: Cardiology

## 2017-06-27 DIAGNOSIS — I252 Old myocardial infarction: Secondary | ICD-10-CM | POA: Insufficient documentation

## 2017-06-27 DIAGNOSIS — I249 Acute ischemic heart disease, unspecified: Secondary | ICD-10-CM | POA: Insufficient documentation

## 2017-06-27 DIAGNOSIS — R079 Chest pain, unspecified: Secondary | ICD-10-CM | POA: Insufficient documentation

## 2017-06-27 DIAGNOSIS — E669 Obesity, unspecified: Secondary | ICD-10-CM | POA: Insufficient documentation

## 2017-06-27 DIAGNOSIS — F1290 Cannabis use, unspecified, uncomplicated: Secondary | ICD-10-CM | POA: Insufficient documentation

## 2017-06-27 DIAGNOSIS — I2510 Atherosclerotic heart disease of native coronary artery without angina pectoris: Secondary | ICD-10-CM | POA: Insufficient documentation

## 2017-06-27 DIAGNOSIS — R9439 Abnormal result of other cardiovascular function study: Secondary | ICD-10-CM | POA: Insufficient documentation

## 2017-06-27 DIAGNOSIS — Z8249 Family history of ischemic heart disease and other diseases of the circulatory system: Secondary | ICD-10-CM | POA: Insufficient documentation

## 2017-06-27 DIAGNOSIS — Z6841 Body Mass Index (BMI) 40.0 and over, adult: Secondary | ICD-10-CM | POA: Insufficient documentation

## 2017-06-27 DIAGNOSIS — Z87891 Personal history of nicotine dependence: Secondary | ICD-10-CM | POA: Insufficient documentation

## 2017-06-27 MED ORDER — TECHNETIUM TC 99M TETROFOSMIN IV KIT
28.8000 | PACK | Freq: Once | INTRAVENOUS | Status: AC | PRN
  Administered 2017-06-27: 28.8 via INTRAVENOUS
  Filled 2017-06-27: qty 29

## 2017-06-28 ENCOUNTER — Ambulatory Visit (HOSPITAL_COMMUNITY)
Admission: RE | Admit: 2017-06-28 | Discharge: 2017-06-28 | Disposition: A | Discharge status: Home / Self Care | Payer: Self-pay | Source: Ambulatory Visit | Attending: Internal Medicine | Admitting: Internal Medicine

## 2017-06-28 LAB — MYOCARDIAL PERFUSION IMAGING
CHL CUP MPHR: 173 {beats}/min
CHL CUP NUCLEAR SSS: 2
CSEPEDS: 0 s
CSEPHR: 89 %
Estimated workload: 9.4 METS
Exercise duration (min): 9 min
LV dias vol: 111 mL (ref 46–106)
LV sys vol: 48 mL
Peak HR: 155 {beats}/min
RPE: 18
Rest HR: 48 {beats}/min
SDS: 0
SRS: 2
TID: 1.02

## 2017-06-28 MED ORDER — TECHNETIUM TC 99M TETROFOSMIN IV KIT
30.5000 | PACK | Freq: Once | INTRAVENOUS | Status: AC | PRN
  Administered 2017-06-28: 30.5 via INTRAVENOUS

## 2017-07-02 NOTE — Progress Notes (Signed)
Cardiology Office Note    Date:  07/03/2017   ID:  Nancy Ayers, DOB 24-Sep-1971, MRN [id number]  PCP:  Patient, No Pcp Per  Cardiologist: Dr. Herbie Baltimore   Chief Complaint  Patient presents with  . Hospitalization Follow-up    History of Present Illness:    Nancy Ayers is a 46 y.o. female with past medical history of HLD and tobacco use who presents to the office today for hospital follow-up.   She was recently admitted from 8/9 - 06/19/2017 for evaluation of chest pain and was found to have an NSTEMI with peak troponin values of 1.23. Her initial EKG showed no acute ischemic changes but a repeat showed ST changes along the inferior leads, therefore she was transferred from [facility] to [facility] for an urgent cardiac catheterization. This showed 65% Prox LAD stenosis (FFR 0.87) and 100% Distal LAD lesion which was thought to be a thromboembolism from the initial lesion. The proximal LAD lesion was treated medically due to FFR being insignificant and being at a bifurication and the distal LAD was too small for intervention. She was treated with Heparin for 48 hours with BB and statin therapy being added. A NST was recommended as an outpatient to assess for ischemia. This was performed and showed evidence of a prior MI but no evidence of ischemia, overall being a low-risk study.   In talking with the patient today, she reports doing well since her recent hospitalization. She did experience one episode of chest discomfort and jaw pain this past Saturday which occurred when checking out of a hotel. She reports being mildly anxious at that time and took an extra Carvedilol with resolution of her symptoms. She denies any recurrent chest discomfort or dyspnea on exertion. No recent orthopnea, PND, lower extremity edema, or palpitations.  She reports good compliance with her medication regimen including ASA and Plavix. She denies any evidence of active bleeding with this. She has not checked blood  pressure regularly but it is at 128/90 during today's visit. She is still smoking 1-2 cigarettes per day but is trying to quit.    Past Medical History:  Diagnosis Date  . Anxiety    pt takes PRN seroquel  . CAD (coronary artery disease), native coronary artery    a. 06/2017: NSTEMI with cath showing 65% Prox LAD stenosis (FFR 0.87) and 100% Distal LAD lesion which was thought to be a thromboembolism from the initial lesion and too small for stenting. Medical management recommended.  . Depression   . Hyperlipidemia   . Tobacco use     Past Surgical History:  Procedure Laterality Date  . COMBINED HYSTEROSCOPY DIAGNOSTIC / D&C  10/2016   d&c and ablation per chart review d/t irregular menses, fibroids  . INTRAVASCULAR PRESSURE WIRE/FFR STUDY N/A 06/15/2017   Procedure: INTRAVASCULAR PRESSURE WIRE/FFR STUDY;  Surgeon: Marykay Lex, MD;  Location: [facility] INVASIVE CV LAB;  Service: Cardiovascular;  Laterality: N/A;  . KNEE SURGERY    . KNEE SURGERY    . LEFT HEART CATH AND CORONARY ANGIOGRAPHY N/A 06/15/2017   Procedure: LEFT HEART CATH AND CORONARY ANGIOGRAPHY;  Surgeon: Marykay Lex, MD;  Location: [facility] INVASIVE CV LAB;  Service: Cardiovascular;  Laterality: N/A;  . TONSILLECTOMY    . TUBAL LIGATION      Current Medications: Outpatient Medications Prior to Visit  Medication Sig Dispense Refill  . aspirin EC 81 MG EC tablet Take 1 tablet (81 mg total) by mouth daily. 30 tablet   .  ibuprofen (ADVIL,MOTRIN) 800 MG tablet Take 800 mg by mouth every 8 (eight) hours as needed for mild pain.    . nitroGLYCERIN (NITROSTAT) 0.4 MG SL tablet Place 1 tablet (0.4 mg total) under the tongue every 5 (five) minutes x 3 doses as needed for chest pain. 25 tablet 0  . sertraline (ZOLOFT) 50 MG tablet Take 50 mg by mouth daily.     Marland Kitchen atorvastatin (LIPITOR) 80 MG tablet Take 1 tablet (80 mg total) by mouth daily at 6 PM. 30 tablet 0  . carvedilol (COREG) 12.5 MG tablet Take 1 tablet (12.5 mg total) by  mouth 2 (two) times daily with a meal. 60 tablet 0  . clopidogrel (PLAVIX) 75 MG tablet Take 1 tablet (75 mg total) by mouth daily with breakfast. 30 tablet 0  . isosorbide mononitrate (IMDUR) 30 MG 24 hr tablet Take 1 tablet (30 mg total) by mouth daily. 30 tablet 0   No facility-administered medications prior to visit.      Allergies:   Patient has no known allergies.   Social History   Social History  . Marital status: Single    Spouse name: N/A  . Number of children: N/A  . Years of education: N/A   Occupational History  . unemployed     currently looking for work   Social History Main Topics  . Smoking status: Current Every Day Smoker    Packs/day: 0.50    Types: Cigarettes  . Smokeless tobacco: Never Used  . Alcohol use Yes     Comment: occasional (less than 1x/week)  . Drug use: Yes    Types: Marijuana     Comment: last time 2 weeks ago  . Sexual activity: Yes    Birth control/ protection: Surgical   Other Topics Concern  . None   Social History Narrative   Single. Has relationship x 7+ yrs. Has 2 children ages 66 and 69. Also has an adoptive son that is 83. Daughter, Significant other and grandson ive in the home. Dog in the home. Drive and has own transportation. Works at [employer]. Sits most of the day. Watches tv and likes get togethers.Liliane Shi music and to dance. Smokes 1/2 ppd for 26 years. Drinks on the weekends. May have (2) 24 oz cans. Smokes mariajuana 5/7 days a week.      Family History:  The patient's family history includes Diabetes in her sister; Heart disease in her mother; Hypertension in her father; Lung cancer in her father; Peripheral vascular disease in her mother; Sickle cell anemia in her brother.   Review of Systems:   Please see the history of present illness.     General:  No chills, fever, night sweats or weight changes.  Cardiovascular:  No edema, orthopnea, palpitations, paroxysmal nocturnal dyspnea. Positive for chest pain and  dyspnea on exertion (now resolved).  Dermatological: No rash, lesions/masses Respiratory: No cough, dyspnea Urologic: No hematuria, dysuria Abdominal:   No nausea, vomiting, diarrhea, bright red blood per rectum, melena, or hematemesis Neurologic:  No visual changes, wkns, changes in mental status. All other systems reviewed and are otherwise negative except as noted above.   Physical Exam:    VS:  BP 128/90   Pulse 60   Ht 5\' 1"  (1.549 m)   Wt 226 lb (102.5 kg)   LMP 06/23/2016   BMI 42.70 kg/m    General: Well developed, overweight African American female appearing in no acute distress. Head: Normocephalic, atraumatic, sclera non-icteric, no xanthomas,  nares are without discharge.  Neck: No carotid bruits. JVD not elevated.  Lungs: Respirations regular and unlabored, without wheezes or rales.  Heart: Regular rate and rhythm. No S3 or S4.  No murmur, no rubs, or gallops appreciated. Abdomen: Soft, non-tender, non-distended with normoactive bowel sounds. No hepatomegaly. No rebound/guarding. No obvious abdominal masses. Msk:  Strength and tone appear normal for age. No joint deformities or effusions. Extremities: No clubbing or cyanosis. No lower extremity edema.  Distal pedal pulses are 2+ bilaterally. Right radial cath site without ecchymosis or evidence of a hematoma.  Neuro: Alert and oriented X 3. Moves all extremities spontaneously. No focal deficits noted. Psych:  Responds to questions appropriately with a normal affect. Skin: No rashes or lesions noted  Wt Readings from Last 3 Encounters:  07/03/17 226 lb (102.5 kg)  06/27/17 226 lb (102.5 kg)  06/19/17 226 lb 4.8 oz (102.6 kg)     Studies/Labs Reviewed:   EKG:  EKG is ordered today.  The ekg ordered today demonstrates NSR with PAC's, HR 60, with TWI along inferior and lateral leads.   Recent Labs: 06/15/2017: ALT 15; TSH 1.272 06/16/2017: BUN <5; Creatinine, Ser 0.88; Potassium 3.9; Sodium 133 06/18/2017: Hemoglobin  11.6; Platelets 215   Lipid Panel    Component Value Date/Time   CHOL 154 06/16/2017 0230   TRIG 166 (H) 06/16/2017 0230   HDL 35 (L) 06/16/2017 0230   CHOLHDL 4.4 06/16/2017 0230   VLDL 33 06/16/2017 0230   LDLCALC 86 06/16/2017 0230    Additional studies/ records that were reviewed today include:   Cardiac Catheterization: 06/2017  Prox LAD lesion, 65 %stenosed. FFR 0.87. This would suggest that is not currently physiologic be significant. Since it is a bifurcation lesion, I have chosen to treat medically for now with IV heparin.  Dist LAD lesion, 100 %stenosed. Likely thromboembolism from the option lesion  The left ventricular systolic function is normal. The left ventricular ejection fraction is 55-65% by visual estimate.  LV end diastolic pressure is normal.   Moderate to severe proximal LAD lesion of D1 with likely distal embolization from the residual lesion down the distal LAD to the apex. At this distribution is waitress distal and small caliber for intervention.  Plan: She'll be admitted to the ICU as she is having active pain.  Continue IV nitroglycerin overnight and wean off as tolerated  IV heparin to start it after sheath removal and run for 48 hours minimum, given non-STEMI  We will start Plavix, and would recommend statin and blood pressure control likely a beta blocker depending on her blood pressures in the morning.  She can likely be fast- tracked pending stable symptoms.  She were to initiate active exertional chest pain after she recovered from this initial MI, it is likely that the LAD lesion as could have healed in a more significant manner. In that setting, would recommend PCI to LAD.  Echocardiogram: 06/2017 Study Conclusions  - Left ventricle: The cavity size was normal. There was mild   concentric hypertrophy. Systolic function was normal. The   estimated ejection fraction was in the range of 60% to 65%. Wall   motion was normal; there were  no regional wall motion   abnormalities. Left ventricular diastolic function parameters   were normal. - Aortic valve: There was no regurgitation. - Aortic root: The aortic root was normal in size. - Mitral valve: There was mild regurgitation. - Right ventricle: Systolic function was normal. - Tricuspid valve: There was  mild regurgitation. - Pulmonary arteries: Systolic pressure was within the normal   range. PA peak pressure: 32 mm Hg (S). - Inferior vena cava: The vessel was normal in size. - Pericardium, extracardiac: There was no pericardial effusion.   NST: 06/28/2017  The left ventricular ejection fraction is normal (55-65%).  Nuclear stress EF: 57%.  There was no ST segment deviation noted during stress.  Blood pressure demonstrated a normal response to exercise.  Findings consistent with prior myocardial infarction.  This is a low risk study.   Small inferior wall infarct at mid and apical level no ischemia  EF 57% Baseline ECG with biphasic T waves in 3,F which pseudo normalized with stress  Assessment:    1. Coronary artery disease involving native coronary artery of native heart without angina pectoris   2. Non-ST elevation myocardial infarction (NSTEMI), subsequent episode of care (HCC)   3. Hyperlipidemia LDL goal <70   4. Essential hypertension   5. Tobacco use      Plan:   In order of problems listed above:  1. CAD/ Subsequent Episode of Care for NSTEMI - recently admitted for evaluation of chest pain and was found to have a peak troponin of 1.23. Cardiac catheterization showed 65% Prox LAD stenosis (FFR 0.87) and 100% Distal LAD lesion which was thought to be a thromboembolism from the initial lesion. The proximal LAD lesion was treated medically due to FFR being insignificant and being at a bifurication and the distal LAD was too small for intervention.  - repeat NST showed evidence of a prior MI but no evidence of ischemia, overall being a low-risk  study. Repeat EKG today shows TWI as above which is similar to prior tracings.   - she has not experienced any recurrent chest pain with exertion or dyspnea. Did have an episode of pain this past weekend which occurred in the setting of anxiety and was relieved with an extra tablet of her BB.  - continue medical management with ASA, Plavix, statin, BB, and Imdur therapy. Smoking cessation strongly advised.   2. HLD - Lipid Panel during recent admission showed total cholesterol of 154, HDL 25, and LDL 86. Goal LDL is < 70 with known CAD. - has been started on Atorvastatin 80mg  daily. Will need repeat FLP/LFT's in 4-6 weeks.   3. HTN - BP is at 128/90 during today's visit. - continue Coreg 12.5mg  BID and Imdur 30mg  daily.   4. Tobacco use - has reduced use but is still smoking 1-2 cigarettes per day. Congratulated on her reduction with complete cessation advised.    Medication Adjustments/Labs and Tests Ordered: Current medicines are reviewed at length with the patient today.  Concerns regarding medicines are outlined above.  Medication changes, Labs and Tests ordered today are listed in the Patient Instructions below. Patient Instructions  Medication Instructions:  Your physician recommends that you continue on your current medications as directed. Please refer to the Current Medication list given to you today.  Follow-Up: Your physician recommends that you schedule a follow-up appointment in: 3 MONTHS with Dr. Herbie Baltimore.   Any Other Special Instructions Will Be Listed Below (If Applicable).   If you need a refill on your cardiac medications before your next appointment, please call your pharmacy.   Signed, Ellsworth Lennox, PA-C  07/03/2017 12:40 PM    [facility] [address] [place], [address] [place], [place]  [address] Phone: [phone]; Fax: [phone]  [address], [address] [place],  [place] [address] Phone: [phone]

## 2017-07-03 ENCOUNTER — Ambulatory Visit (INDEPENDENT_AMBULATORY_CARE_PROVIDER_SITE_OTHER): Payer: Self-pay | Admitting: Student

## 2017-07-03 ENCOUNTER — Encounter: Payer: Self-pay | Admitting: Student

## 2017-07-03 VITALS — BP 128/90 | HR 60 | Ht 61.0 in | Wt 226.0 lb

## 2017-07-03 DIAGNOSIS — I214 Non-ST elevation (NSTEMI) myocardial infarction: Secondary | ICD-10-CM

## 2017-07-03 DIAGNOSIS — Z720 Tobacco use: Secondary | ICD-10-CM

## 2017-07-03 DIAGNOSIS — I2510 Atherosclerotic heart disease of native coronary artery without angina pectoris: Secondary | ICD-10-CM

## 2017-07-03 DIAGNOSIS — E785 Hyperlipidemia, unspecified: Secondary | ICD-10-CM

## 2017-07-03 DIAGNOSIS — I10 Essential (primary) hypertension: Secondary | ICD-10-CM

## 2017-07-03 MED ORDER — ISOSORBIDE MONONITRATE ER 30 MG PO TB24: 30.0000 mg | ORAL_TABLET | Freq: Every day | ORAL | 3 refills | Status: DC

## 2017-07-03 MED ORDER — ATORVASTATIN CALCIUM 80 MG PO TABS: 80.0000 mg | ORAL_TABLET | Freq: Every day | ORAL | 3 refills | Status: DC

## 2017-07-03 MED ORDER — ISOSORBIDE MONONITRATE ER 30 MG PO TB24: 30.0000 mg | ORAL_TABLET | Freq: Every day | ORAL | 3 refills | Status: AC

## 2017-07-03 MED ORDER — CARVEDILOL 12.5 MG PO TABS: 12.5000 mg | ORAL_TABLET | Freq: Two times a day (BID) | ORAL | 3 refills | Status: DC

## 2017-07-03 MED ORDER — ATORVASTATIN CALCIUM 80 MG PO TABS: 80.0000 mg | ORAL_TABLET | Freq: Every day | ORAL | 3 refills | Status: AC

## 2017-07-03 MED ORDER — CLOPIDOGREL BISULFATE 75 MG PO TABS: 75.0000 mg | ORAL_TABLET | Freq: Every day | ORAL | 3 refills | Status: DC

## 2017-07-03 MED ORDER — CLOPIDOGREL BISULFATE 75 MG PO TABS: 75.0000 mg | ORAL_TABLET | Freq: Every day | ORAL | 3 refills | Status: AC

## 2017-07-03 NOTE — Patient Instructions (Signed)
Medication Instructions:  Your physician recommends that you continue on your current medications as directed. Please refer to the Current Medication list given to you today.  Follow-Up: Your physician recommends that you schedule a follow-up appointment in: 3 MONTHS with Dr. Herbie Baltimore.   Any Other Special Instructions Will Be Listed Below (If Applicable).     If you need a refill on your cardiac medications before your next appointment, please call your pharmacy.

## 2017-07-12 ENCOUNTER — Telehealth: Payer: Self-pay | Admitting: *Deleted

## 2017-07-12 NOTE — Telephone Encounter (Signed)
FAXED [facility] PHYSICIAN REFERRAL FORM SIGNED BY DR HARDING. 

## 2017-10-04 ENCOUNTER — Ambulatory Visit: Payer: Self-pay | Admitting: Cardiology

## 2018-01-04 IMAGING — US US ABDOMEN LIMITED
1 series · 13 of 25 positions shown · non-contrast
Comparison: CT of the abdomen without contrast and right upper
quadrant ultrasound at [REDACTED] on 03/15/2012

CLINICAL DATA: Right upper quadrant abdominal pain and chest pain.

EXAM:
ULTRASOUND ABDOMEN LIMITED RIGHT UPPER QUADRANT

[Series 1: us abdomen limited · 0.24mm/px · 13 of 53 slices shown]
[im 1/53]
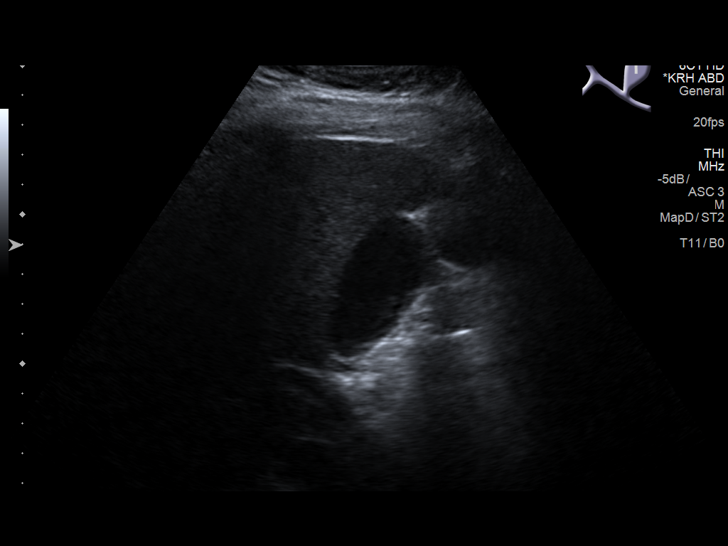
[im 5/53]
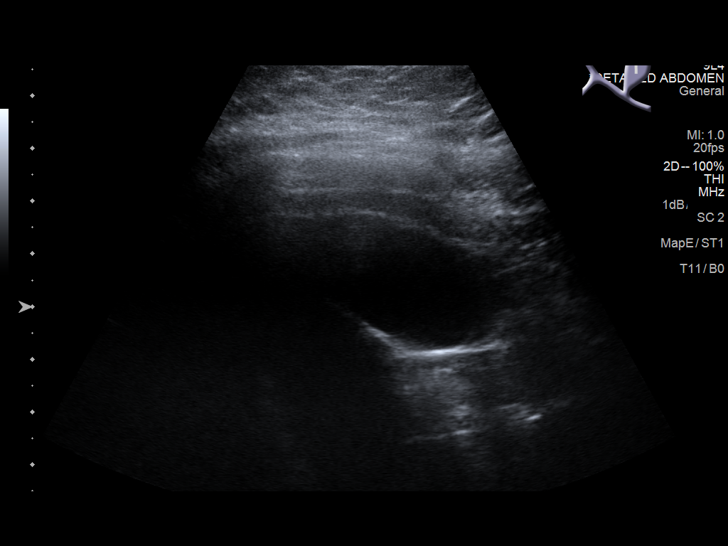
[im 9/53]
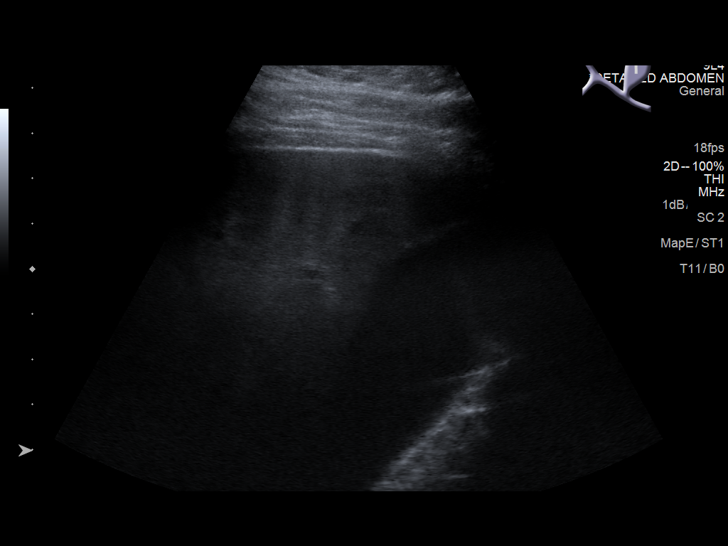
[im 14/53]
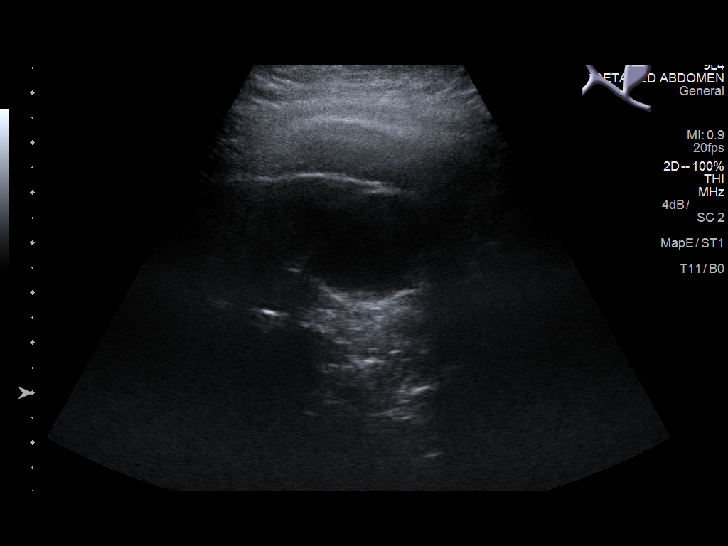
[im 18/53]
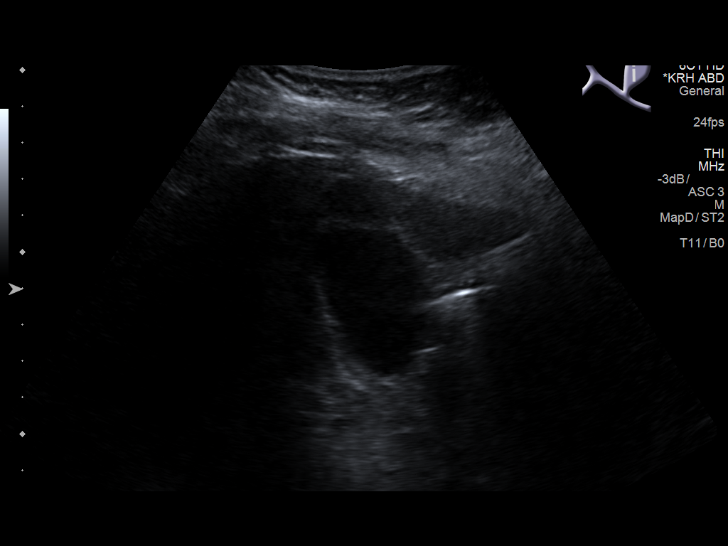
[im 22/53]
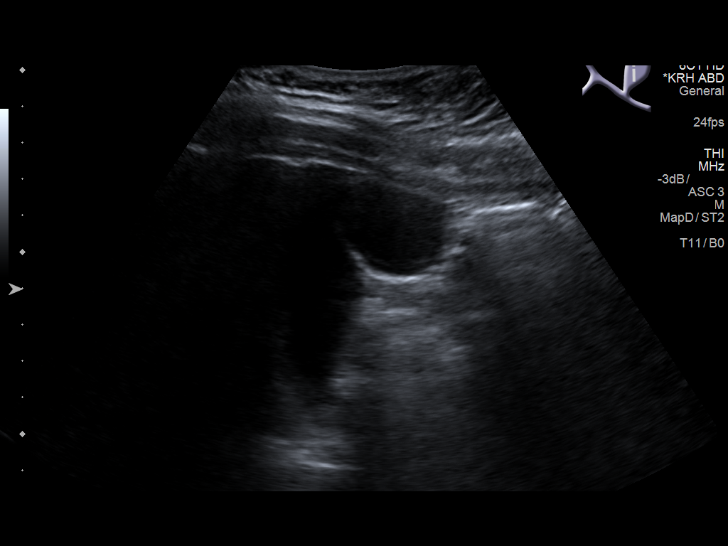
[im 27/53]
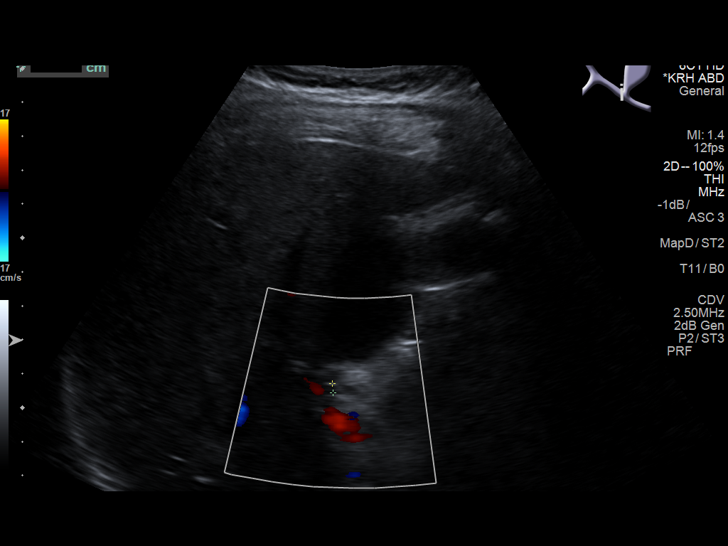
[im 31/53]
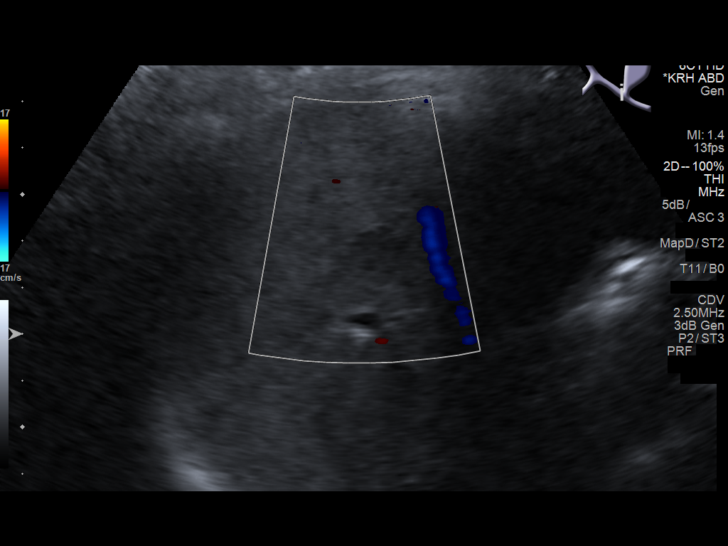
[im 35/53]
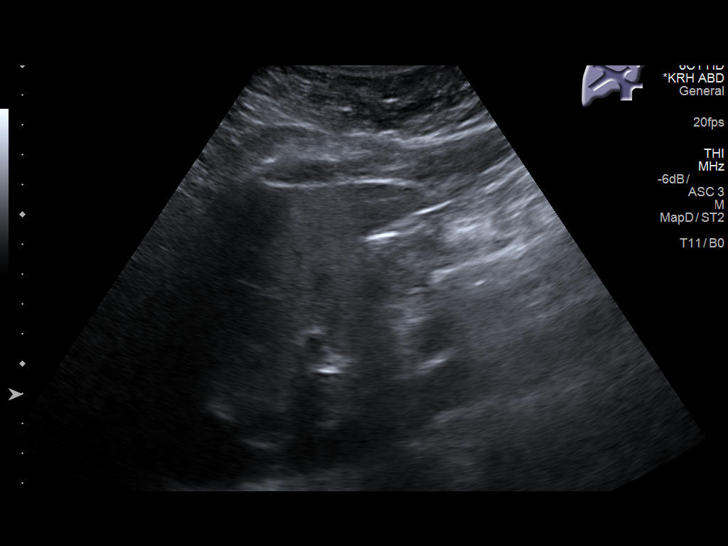
[im 40/53]
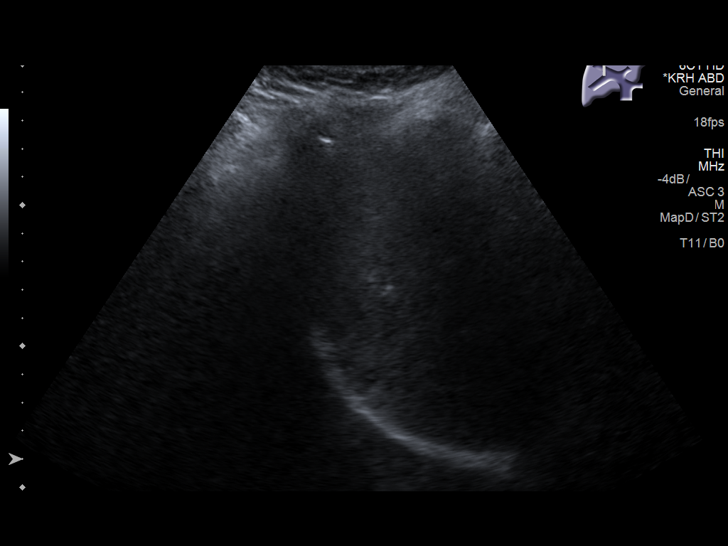
[im 44/53]
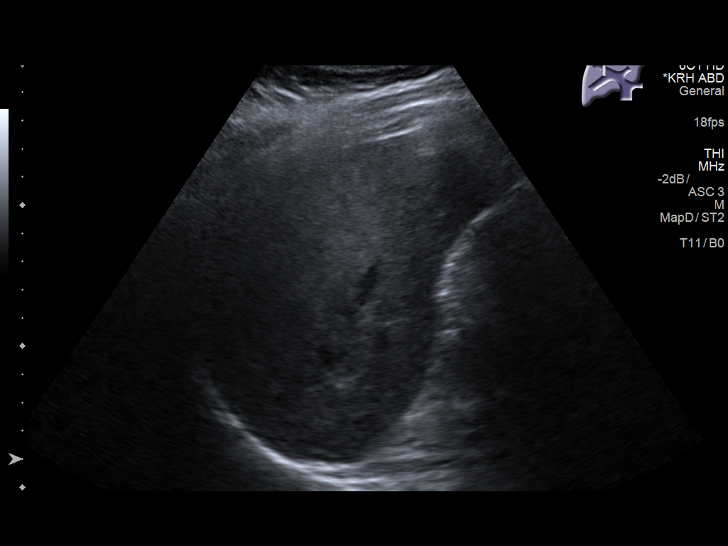
[im 48/53]
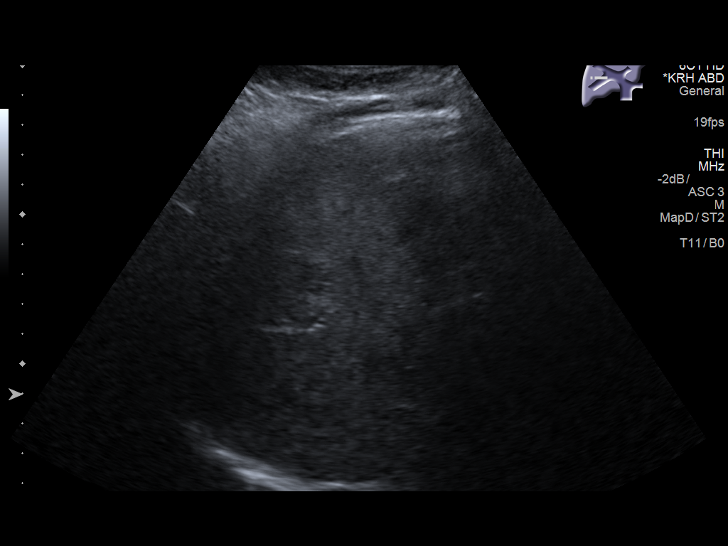
[im 53/53]
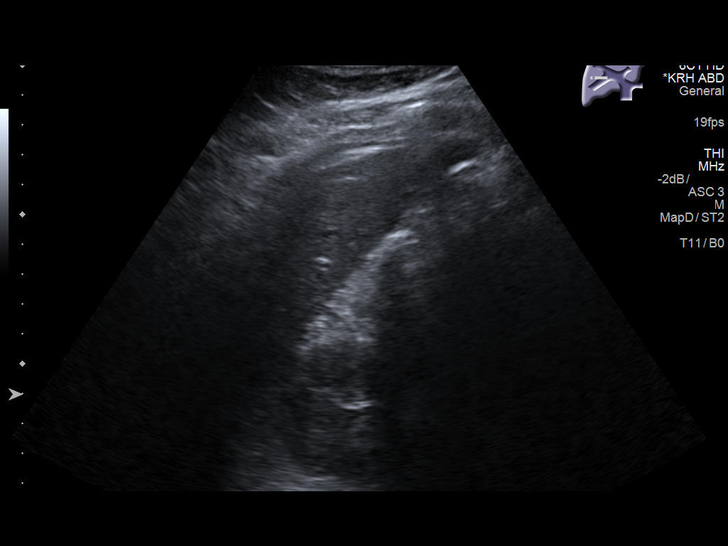

[13 of 25 positions shown; findings below may reference images not displayed]

FINDINGS: Gallbladder:

No gallstones or wall thickening visualized. No sonographic Murphy
sign noted by sonographer.

Common bile duct:

Diameter: Normal caliber of 3 mm.

Liver:

The liver demonstrates coarse echotexture and increased
echogenicity, likely reflecting steatosis. No overt cirrhotic
contour abnormalities. There is no evidence of intrahepatic biliary
ductal dilatation. The portal vein is open.

Vague hypoechoic rounded area in the right lobe of the liver
measures approximately 1.4 x 1.3 x 1.2 cm. This is not definitively
cystic and may represent a subtle solid lesion. There are some
smaller hypoechoic areas identified which appear to represent benign
cysts. Two separate left lobe cysts measure approximately 0.7 cm and
0.8 cm in greatest diameter respectively.
IMPRESSION: 1. Normal gallbladder.
2. Hepatic steatosis.
3. 1.4 cm hypoechoic rounded area in the right lobe is not
definitively a benign cyst by ultrasound. There are some other
clearly benign subcentimeter cysts in the left lobe. Recommend
correlation with CT of the abdomen with and without contrast
utilizing a liver mass protocol.

## 2018-12-07 ENCOUNTER — Encounter: Payer: Self-pay | Admitting: Cardiology

## 2018-12-07 ENCOUNTER — Ambulatory Visit (INDEPENDENT_AMBULATORY_CARE_PROVIDER_SITE_OTHER): Payer: BLUE CROSS/BLUE SHIELD | Admitting: Cardiology

## 2018-12-07 VITALS — BP 148/80 | HR 62 | Ht 61.0 in | Wt 240.1 lb

## 2018-12-07 DIAGNOSIS — F17200 Nicotine dependence, unspecified, uncomplicated: Secondary | ICD-10-CM

## 2018-12-07 DIAGNOSIS — I2510 Atherosclerotic heart disease of native coronary artery without angina pectoris: Secondary | ICD-10-CM

## 2018-12-07 DIAGNOSIS — E782 Mixed hyperlipidemia: Secondary | ICD-10-CM | POA: Diagnosis not present

## 2018-12-07 DIAGNOSIS — I10 Essential (primary) hypertension: Secondary | ICD-10-CM | POA: Diagnosis not present

## 2018-12-07 MED ORDER — METOPROLOL SUCCINATE ER 50 MG PO TB24: 50.0000 mg | ORAL_TABLET | Freq: Every day | ORAL | 3 refills | Status: AC

## 2018-12-07 NOTE — Patient Instructions (Signed)
Medication Instructions:  Your physician has recommended you make the following change in your medication:   STOP carvedilol (coreg)   START metoprolol succinate (toprol-XL) 50 mg: Take 1 tablet daily  If you need a refill on your cardiac medications before your next appointment, please call your pharmacy.   Lab work: None  If you have labs (blood work) drawn today and your tests are completely normal, you will receive your results only by: Marland Kitchen MyChart Message (if you have MyChart) OR . A paper copy in the mail If you have any lab test that is abnormal or we need to change your treatment, we will call you to review the results.  Testing/Procedures: You had an EKG today.   Your physician has requested that you have an echocardiogram. Echocardiography is a painless test that uses sound waves to create images of your heart. It provides your doctor with information about the size and shape of your heart and how well your heart's chambers and valves are working. This procedure takes approximately one hour. There are no restrictions for this procedure.  Follow-Up: At [facility], you and your health needs are our priority.  As part of our continuing mission to provide you with exceptional heart care, we have created designated Provider Care Teams.  These Care Teams include your primary Cardiologist (physician) and Advanced Practice Providers (APPs -  Physician Assistants and Nurse Practitioners) who all work together to provide you with the care you need, when you need it. You will need a follow up appointment in 6 weeks.      Metoprolol extended-release tablets What is this medicine? METOPROLOL (me TOE proe lole) is a beta-blocker. Beta-blockers reduce the workload on the heart and help it to beat more regularly. This medicine is used to treat high blood pressure and to prevent chest pain. It is also used to after a heart attack and to prevent an additional heart attack from  occurring. This medicine may be used for other purposes; ask your health care provider or pharmacist if you have questions. COMMON BRAND NAME(S): toprol, Toprol XL What should I tell my health care provider before I take this medicine? They need to know if you have any of these conditions: -diabetes -heart or vessel disease like slow heart rate, worsening heart failure, heart block, sick sinus syndrome or Raynaud's disease -kidney disease -liver disease -lung or breathing disease, like asthma or emphysema -pheochromocytoma -thyroid disease -an unusual or allergic reaction to metoprolol, other beta-blockers, medicines, foods, dyes, or preservatives -pregnant or trying to get pregnant -breast-feeding How should I use this medicine? Take this medicine by mouth with a glass of water. Follow the directions on the prescription label. Do not crush or chew. Take this medicine with or immediately after meals. Take your doses at regular intervals. Do not take more medicine than directed. Do not stop taking this medicine suddenly. This could lead to serious heart-related effects. Talk to your pediatrician regarding the use of this medicine in children. While this drug may be prescribed for children as young as 6 years for selected conditions, precautions do apply. Overdosage: If you think you have taken too much of this medicine contact a poison control center or emergency room at once. NOTE: This medicine is only for you. Do not share this medicine with others. What if I miss a dose? If you miss a dose, take it as soon as you can. If it is almost time for your next dose, take only that dose.  Do not take double or extra doses. What may interact with this medicine? This medicine may interact with the following medications: -certain medicines for blood pressure, heart disease, irregular heart beat -certain medicines for depression, like monoamine oxidase (MAO) inhibitors, fluoxetine, or  paroxetine -clonidine -dobutamine -epinephrine -isoproterenol -reserpine This list may not describe all possible interactions. Give your health care provider a list of all the medicines, herbs, non-prescription drugs, or dietary supplements you use. Also tell them if you smoke, drink alcohol, or use illegal drugs. Some items may interact with your medicine. What should I watch for while using this medicine? Visit your doctor or health care professional for regular check ups. Contact your doctor right away if your symptoms worsen. Check your blood pressure and pulse rate regularly. Ask your health care professional what your blood pressure and pulse rate should be, and when you should contact them. You may get drowsy or dizzy. Do not drive, use machinery, or do anything that needs mental alertness until you know how this medicine affects you. Do not sit or stand up quickly, especially if you are an older patient. This reduces the risk of dizzy or fainting spells. Contact your doctor if these symptoms continue. Alcohol may interfere with the effect of this medicine. Avoid alcoholic drinks. What side effects may I notice from receiving this medicine? Side effects that you should report to your doctor or health care professional as soon as possible: -allergic reactions like skin rash, itching or hives -cold or numb hands or feet -depression -difficulty breathing -faint -fever with sore throat -irregular heartbeat, chest pain -rapid weight gain -swollen legs or ankles Side effects that usually do not require medical attention (report to your doctor or health care professional if they continue or are bothersome): -anxiety or nervousness -change in sex drive or performance -dry skin -headache -nightmares or trouble sleeping -short term memory loss -stomach upset or diarrhea -unusually tired This list may not describe all possible side effects. Call your doctor for medical advice about side  effects. You may report side effects to FDA at 1-800-FDA-1088. Where should I keep my medicine? Keep out of the reach of children. Store at room temperature between 15 and 30 degrees C (59 and 86 degrees F). Throw away any unused medicine after the expiration date. NOTE: This sheet is a summary. It may not cover all possible information. If you have questions about this medicine, talk to your doctor, pharmacist, or health care provider.  2019 Elsevier/Gold Standard (2013-06-28 14:41:37)     Echocardiogram An echocardiogram is a procedure that uses painless sound waves (ultrasound) to produce an image of the heart. Images from an echocardiogram can provide important information about:  Signs of coronary artery disease (CAD).  Aneurysm detection. An aneurysm is a weak or damaged part of an artery wall that bulges out from the normal force of blood pumping through the body.  Heart size and shape. Changes in the size or shape of the heart can be associated with certain conditions, including heart failure, aneurysm, and CAD.  Heart muscle function.  Heart valve function.  Signs of a past heart attack.  Fluid buildup around the heart.  Thickening of the heart muscle.  A tumor or infectious growth around the heart valves. Tell a health care provider about:  Any allergies you have.  All medicines you are taking, including vitamins, herbs, eye drops, creams, and over-the-counter medicines.  Any blood disorders you have.  Any surgeries you have had.  Any medical  conditions you have.  Whether you are pregnant or may be pregnant. What are the risks? Generally, this is a safe procedure. However, problems may occur, including:  Allergic reaction to dye (contrast) that may be used during the procedure. What happens before the procedure? No specific preparation is needed. You may eat and drink normally. What happens during the procedure?   An IV tube may be inserted into one of  your veins.  You may receive contrast through this tube. A contrast is an injection that improves the quality of the pictures from your heart.  A gel will be applied to your chest.  A wand-like tool (transducer) will be moved over your chest. The gel will help to transmit the sound waves from the transducer.  The sound waves will harmlessly bounce off of your heart to allow the heart images to be captured in real-time motion. The images will be recorded on a computer. The procedure may vary among health care providers and hospitals. What happens after the procedure?  You may return to your normal, everyday life, including diet, activities, and medicines, unless your health care provider tells you not to do that. Summary  An echocardiogram is a procedure that uses painless sound waves (ultrasound) to produce an image of the heart.  Images from an echocardiogram can provide important information about the size and shape of your heart, heart muscle function, heart valve function, and fluid buildup around your heart.  You do not need to do anything to prepare before this procedure. You may eat and drink normally.  After the echocardiogram is completed, you may return to your normal, everyday life, unless your health care provider tells you not to do that. This information is not intended to replace advice given to you by your health care provider. Make sure you discuss any questions you have with your health care provider. Document Released: 10/21/2000 Document Revised: 11/26/2016 Document Reviewed: 11/26/2016 Elsevier Interactive Patient Education  2019 [employer]

## 2018-12-07 NOTE — Progress Notes (Signed)
Cardiology Office Note:    Date:  12/07/2018   ID:  Nancy Ayers, DOB 03/17/1971, MRN [id number]  PCP:  Gilman ButtnerKessler, Amanda Seymour, FNP  Cardiologist:  Gypsy Balsamobert , MD    Referring MD: No ref. provider found   Chief Complaint  Patient presents with  . Follow-up  Here to be reestablished as a patient  History of Present Illness:    Nancy PhillipsWillette Ayers is a 48 y.o. female with coronary artery disease in 2018 she ended up having non-STEMI cardiac catheterization was done which showed noncritical lesion in the proximal LAD however the distal LAD was occluded she did have segmental wall motion abnormality about overall left ventricular ejection fraction was normal.  Since that time she did not have insurance and she did not follow-up with anybody she did not take any medications now she got insurance back and she is trying to be reestablished as a patient denies have any chest pain tightness squeezing pressure burning chest but does get shortness of breath while exercise.  Sadly she still continue to smoke she smokes about half pack per day.  Past Medical History:  Diagnosis Date  . Anxiety    pt takes PRN seroquel  . CAD (coronary artery disease), native coronary artery    a. 06/2017: NSTEMI with cath showing 65% Prox LAD stenosis (FFR 0.87) and 100% Distal LAD lesion which was thought to be a thromboembolism from the initial lesion and too small for stenting. Medical management recommended.  . Depression   . Hyperlipidemia   . NSTEMI (non-ST elevated myocardial infarction) (HCC)   . Tobacco use     Past Surgical History:  Procedure Laterality Date  . COMBINED HYSTEROSCOPY DIAGNOSTIC / D&C  10/2016   d&c and ablation per chart review d/t irregular menses, fibroids  . INTRAVASCULAR PRESSURE WIRE/FFR STUDY N/A 06/15/2017   Procedure: INTRAVASCULAR PRESSURE WIRE/FFR STUDY;  Surgeon: Marykay LexHarding, David W, MD;  [facility];  Service: Cardiovascular;  Laterality: N/A;  . KNEE  SURGERY    . KNEE SURGERY    . LEFT HEART CATH AND CORONARY ANGIOGRAPHY N/A 06/15/2017   Procedure: LEFT HEART CATH AND CORONARY ANGIOGRAPHY;  Surgeon: Marykay LexHarding, David W, MD;  [facility];  Service: Cardiovascular;  Laterality: N/A;  . TONSILLECTOMY    . TUBAL LIGATION      Current Medications: Current Meds  Medication Sig  . aspirin EC 81 MG EC tablet Take 1 tablet (81 mg total) by mouth daily.  Marland Kitchen. atorvastatin (LIPITOR) 80 MG tablet Take 1 tablet (80 mg total) by mouth daily at 6 PM.  . carvedilol (COREG) 12.5 MG tablet Take 1 tablet (12.5 mg total) by mouth 2 (two) times daily with a meal.  . clopidogrel (PLAVIX) 75 MG tablet Take 1 tablet (75 mg total) by mouth daily with breakfast.  . ibuprofen (ADVIL,MOTRIN) 800 MG tablet Take 800 mg by mouth every 8 (eight) hours as needed for mild pain.  . isosorbide mononitrate (IMDUR) 30 MG 24 hr tablet Take 1 tablet (30 mg total) by mouth daily.  . nitroGLYCERIN (NITROSTAT) 0.4 MG SL tablet Place 1 tablet (0.4 mg total) under the tongue every 5 (five) minutes x 3 doses as needed for chest pain.  Marland Kitchen. sertraline (ZOLOFT) 50 MG tablet Take 50 mg by mouth daily.      Allergies:   Patient has no known allergies.   Social History   Socioeconomic History  . Marital status: Single    Spouse name: Not  on file  . Number of children: Not on file  . Years of education: Not on file  . Highest education level: Not on file  Occupational History  . Occupation: unemployed    Comment: currently looking for work  Social Needs  . Financial resource strain: Not on file  . Food insecurity:    Worry: Not on file    Inability: Not on file  . Transportation needs:    Medical: Not on file    Non-medical: Not on file  Tobacco Use  . Smoking status: Current Every Day Smoker    Packs/day: 0.50    Types: Cigarettes  . Smokeless tobacco: Never Used  Substance and Sexual Activity  . Alcohol use: Yes    Comment: occasional (less than 1x/week)    . Drug use: Yes    Types: Marijuana    Comment: last time 2 weeks ago  . Sexual activity: Yes    Birth control/protection: Surgical  Lifestyle  . Physical activity:    Days per week: Not on file    Minutes per session: Not on file  . Stress: Not on file  Relationships  . Social connections:    Talks on phone: Not on file    Gets together: Not on file    Attends religious service: Not on file    Active member of club or organization: Not on file    Attends meetings of clubs or organizations: Not on file    Relationship status: Not on file  Other Topics Concern  . Not on file  Social History Narrative   Single. Has relationship x 7+ yrs. Has 2 children ages 32 and 6. Also has an adoptive son that is 7. Daughter, Significant other and grandson ive in the home. Dog in the home. Drive and has own transportation. Works at [employer]. Sits most of the day. Watches tv and likes get togethers.Liliane Shi music and to dance. Smokes 1/2 ppd for 26 years. Drinks on the weekends. May have (2) 24 oz cans. Smokes mariajuana 5/7 days a week.      Family History: The patient's family history includes Diabetes in her sister; Heart disease in her mother; Hypertension in her father; Lung cancer in her father; Peripheral vascular disease in her mother; Sickle cell anemia in her brother. ROS:   Please see the history of present illness.    All 14 point review of systems negative except as described per history of present illness  EKGs/Labs/Other Studies Reviewed:    Sinus rhythm normal P interval normal QS complex duration morphology no ST segment changes  Recent Labs: No results found for requested labs within last 8760 hours.  Recent Lipid Panel    Component Value Date/Time   CHOL 154 06/16/2017 0230   TRIG 166 (H) 06/16/2017 0230   HDL 35 (L) 06/16/2017 0230   CHOLHDL 4.4 06/16/2017 0230   VLDL 33 06/16/2017 0230   LDLCALC 86 06/16/2017 0230    Physical Exam:    VS:  BP (!) 148/80    Pulse 62   Ht 5\' 1"  (1.549 m)   Wt 240 lb 1.9 oz (108.9 kg)   LMP 06/23/2016   SpO2 98%   BMI 45.37 kg/m     Wt Readings from Last 3 Encounters:  12/07/18 240 lb 1.9 oz (108.9 kg)  07/03/17 226 lb (102.5 kg)  06/27/17 226 lb (102.5 kg)     GEN:  Well nourished, well developed in no acute distress HEENT: Normal NECK:  No JVD; No carotid bruits LYMPHATICS: No lymphadenopathy CARDIAC: RRR, no murmurs, no rubs, no gallops RESPIRATORY:  Clear to auscultation without rales, wheezing or rhonchi  ABDOMEN: Soft, non-tender, non-distended MUSCULOSKELETAL:  No edema; No deformity  SKIN: Warm and dry LOWER EXTREMITIES: no swelling NEUROLOGIC:  Alert and oriented x 3 PSYCHIATRIC:  Normal affect   ASSESSMENT:    1. Coronary artery disease involving native coronary artery of native heart without angina pectoris   2. Essential hypertension   3. Smoking   4. Mixed hyperlipidemia    PLAN:    In order of problems listed above:  1. Coronary artery disease status post non-STEMI involving apex she is she is on aspirin I will continue.  I will switch her from Coreg since she is telling me that she does not take a second dose because she simply forgot all the time so I will switch her to Toprol-XL 50 mg daily.  Will continue with Lipitor 80.  She will have echocardiogram done to assess left ventricular ejection fraction in the future anticipate need to add ACE inhibitor. 2. Essential hypertension first visit blood pressure slightly on the higher side but this is something will be addressed in the future 3. Smoking long discussion at least 10 minutes about quitting she understands a problem she is trying to work on it 4. Mixed dyslipidemia we will start her back on Lipitor 80 and then in 6 weeks we will recheck her fasting lipid profile.   Medication Adjustments/Labs and Tests Ordered: Current medicines are reviewed at length with the patient today.  Concerns regarding medicines are outlined  above.  No orders of the defined types were placed in this encounter.  Medication changes: No orders of the defined types were placed in this encounter.   Signed, Georgeanna Lea, MD, [facility] 12/07/2018 9:56 AM    [facility]

## 2018-12-20 ENCOUNTER — Ambulatory Visit (HOSPITAL_BASED_OUTPATIENT_CLINIC_OR_DEPARTMENT_OTHER): Payer: BLUE CROSS/BLUE SHIELD

## 2019-01-17 ENCOUNTER — Ambulatory Visit: Payer: BLUE CROSS/BLUE SHIELD | Admitting: Cardiology

## 2019-06-25 ENCOUNTER — Other Ambulatory Visit: Payer: Self-pay

## 2019-06-25 DIAGNOSIS — Z20822 Contact with and (suspected) exposure to covid-19: Secondary | ICD-10-CM

## 2019-06-26 LAB — NOVEL CORONAVIRUS, NAA: SARS-CoV-2, NAA: NOT DETECTED

## 2021-10-29 ENCOUNTER — Encounter (HOSPITAL_COMMUNITY): Payer: BLUE CROSS/BLUE SHIELD

## 2021-11-04 ENCOUNTER — Ambulatory Visit (HOSPITAL_COMMUNITY): Admission: RE | Admit: 2021-11-04 | Payer: BLUE CROSS/BLUE SHIELD | Source: Ambulatory Visit

## 2021-11-04 ENCOUNTER — Other Ambulatory Visit (HOSPITAL_COMMUNITY): Payer: Self-pay | Admitting: Family Medicine

## 2021-11-04 DIAGNOSIS — I739 Peripheral vascular disease, unspecified: Secondary | ICD-10-CM

## 2021-11-11 ENCOUNTER — Other Ambulatory Visit: Payer: Self-pay

## 2021-11-11 ENCOUNTER — Ambulatory Visit (HOSPITAL_COMMUNITY)
Admission: RE | Admit: 2021-11-11 | Discharge: 2021-11-11 | Disposition: A | Discharge status: Home / Self Care | Payer: BLUE CROSS/BLUE SHIELD | Source: Ambulatory Visit | Attending: Family Medicine | Admitting: Family Medicine

## 2021-11-11 DIAGNOSIS — I739 Peripheral vascular disease, unspecified: Secondary | ICD-10-CM | POA: Diagnosis not present
